# Patient Record
Sex: Female | Born: 1945
Health system: Southern US, Community
[De-identification: ages and names within clinical notes are randomized; demographics above are authoritative.]

## PROBLEM LIST (undated history)

## (undated) DIAGNOSIS — E119 Type 2 diabetes mellitus without complications: Secondary | ICD-10-CM

## (undated) DIAGNOSIS — J189 Pneumonia, unspecified organism: Secondary | ICD-10-CM

## (undated) DIAGNOSIS — T8859XA Other complications of anesthesia, initial encounter: Secondary | ICD-10-CM

## (undated) DIAGNOSIS — K219 Gastro-esophageal reflux disease without esophagitis: Secondary | ICD-10-CM

## (undated) DIAGNOSIS — C50811 Malignant neoplasm of overlapping sites of right female breast: Secondary | ICD-10-CM

## (undated) DIAGNOSIS — R112 Nausea with vomiting, unspecified: Secondary | ICD-10-CM

## (undated) DIAGNOSIS — Z9889 Other specified postprocedural states: Secondary | ICD-10-CM

## (undated) DIAGNOSIS — Z8489 Family history of other specified conditions: Secondary | ICD-10-CM

## (undated) DIAGNOSIS — K5792 Diverticulitis of intestine, part unspecified, without perforation or abscess without bleeding: Secondary | ICD-10-CM

## (undated) DIAGNOSIS — R519 Headache, unspecified: Secondary | ICD-10-CM

## (undated) DIAGNOSIS — F32A Depression, unspecified: Secondary | ICD-10-CM

## (undated) DIAGNOSIS — M1990 Unspecified osteoarthritis, unspecified site: Secondary | ICD-10-CM

## (undated) DIAGNOSIS — F419 Anxiety disorder, unspecified: Secondary | ICD-10-CM

## (undated) DIAGNOSIS — R51 Headache: Secondary | ICD-10-CM

## (undated) DIAGNOSIS — F329 Major depressive disorder, single episode, unspecified: Secondary | ICD-10-CM

## (undated) DIAGNOSIS — N814 Uterovaginal prolapse, unspecified: Secondary | ICD-10-CM

## (undated) DIAGNOSIS — T4145XA Adverse effect of unspecified anesthetic, initial encounter: Secondary | ICD-10-CM

## (undated) HISTORY — PX: COLON SURGERY: SHX602

## (undated) HISTORY — PX: HERNIA REPAIR: SHX51

---

## 2003-10-22 ENCOUNTER — Inpatient Hospital Stay (HOSPITAL_COMMUNITY): Admission: AD | Admit: 2003-10-22 | Discharge: 2003-10-26 | Payer: Self-pay | Admitting: Psychiatry

## 2016-03-03 DIAGNOSIS — M5416 Radiculopathy, lumbar region: Secondary | ICD-10-CM | POA: Diagnosis not present

## 2016-03-03 DIAGNOSIS — M25551 Pain in right hip: Secondary | ICD-10-CM | POA: Diagnosis not present

## 2016-03-03 DIAGNOSIS — M87851 Other osteonecrosis, right femur: Secondary | ICD-10-CM | POA: Diagnosis not present

## 2016-03-03 DIAGNOSIS — M87051 Idiopathic aseptic necrosis of right femur: Secondary | ICD-10-CM | POA: Diagnosis not present

## 2016-03-03 DIAGNOSIS — M545 Low back pain: Secondary | ICD-10-CM | POA: Diagnosis not present

## 2016-03-03 DIAGNOSIS — M79671 Pain in right foot: Secondary | ICD-10-CM | POA: Diagnosis not present

## 2016-03-15 DIAGNOSIS — M5410 Radiculopathy, site unspecified: Secondary | ICD-10-CM | POA: Diagnosis not present

## 2016-03-15 DIAGNOSIS — M25551 Pain in right hip: Secondary | ICD-10-CM | POA: Diagnosis not present

## 2016-03-15 DIAGNOSIS — M5136 Other intervertebral disc degeneration, lumbar region: Secondary | ICD-10-CM | POA: Diagnosis not present

## 2016-04-08 DIAGNOSIS — D485 Neoplasm of uncertain behavior of skin: Secondary | ICD-10-CM | POA: Diagnosis not present

## 2016-04-08 DIAGNOSIS — G43709 Chronic migraine without aura, not intractable, without status migrainosus: Secondary | ICD-10-CM | POA: Diagnosis not present

## 2016-04-08 DIAGNOSIS — Z9181 History of falling: Secondary | ICD-10-CM | POA: Diagnosis not present

## 2016-04-08 DIAGNOSIS — R7301 Impaired fasting glucose: Secondary | ICD-10-CM | POA: Diagnosis not present

## 2016-04-08 DIAGNOSIS — K219 Gastro-esophageal reflux disease without esophagitis: Secondary | ICD-10-CM | POA: Diagnosis not present

## 2016-04-08 DIAGNOSIS — Z79899 Other long term (current) drug therapy: Secondary | ICD-10-CM | POA: Diagnosis not present

## 2016-04-08 DIAGNOSIS — E785 Hyperlipidemia, unspecified: Secondary | ICD-10-CM | POA: Diagnosis not present

## 2016-04-08 DIAGNOSIS — Z23 Encounter for immunization: Secondary | ICD-10-CM | POA: Diagnosis not present

## 2016-04-08 DIAGNOSIS — E669 Obesity, unspecified: Secondary | ICD-10-CM | POA: Diagnosis not present

## 2016-04-08 DIAGNOSIS — Z0000 Encounter for general adult medical examination without abnormal findings: Secondary | ICD-10-CM | POA: Diagnosis not present

## 2016-04-08 DIAGNOSIS — L728 Other follicular cysts of the skin and subcutaneous tissue: Secondary | ICD-10-CM | POA: Diagnosis not present

## 2016-04-08 DIAGNOSIS — Z1211 Encounter for screening for malignant neoplasm of colon: Secondary | ICD-10-CM | POA: Diagnosis not present

## 2016-04-08 DIAGNOSIS — F329 Major depressive disorder, single episode, unspecified: Secondary | ICD-10-CM | POA: Diagnosis not present

## 2016-05-21 DIAGNOSIS — Z1211 Encounter for screening for malignant neoplasm of colon: Secondary | ICD-10-CM | POA: Diagnosis not present

## 2016-05-21 DIAGNOSIS — Z8719 Personal history of other diseases of the digestive system: Secondary | ICD-10-CM | POA: Diagnosis not present

## 2016-06-21 DIAGNOSIS — Z8719 Personal history of other diseases of the digestive system: Secondary | ICD-10-CM | POA: Diagnosis not present

## 2016-06-21 DIAGNOSIS — K621 Rectal polyp: Secondary | ICD-10-CM | POA: Diagnosis not present

## 2016-06-21 DIAGNOSIS — Z1211 Encounter for screening for malignant neoplasm of colon: Secondary | ICD-10-CM | POA: Diagnosis not present

## 2016-06-21 DIAGNOSIS — Z9049 Acquired absence of other specified parts of digestive tract: Secondary | ICD-10-CM | POA: Diagnosis not present

## 2016-06-21 DIAGNOSIS — K5730 Diverticulosis of large intestine without perforation or abscess without bleeding: Secondary | ICD-10-CM | POA: Diagnosis not present

## 2016-06-21 DIAGNOSIS — Z7982 Long term (current) use of aspirin: Secondary | ICD-10-CM | POA: Diagnosis not present

## 2016-06-21 DIAGNOSIS — M810 Age-related osteoporosis without current pathological fracture: Secondary | ICD-10-CM | POA: Diagnosis not present

## 2016-06-21 DIAGNOSIS — K635 Polyp of colon: Secondary | ICD-10-CM | POA: Diagnosis not present

## 2016-06-21 DIAGNOSIS — Z79899 Other long term (current) drug therapy: Secondary | ICD-10-CM | POA: Diagnosis not present

## 2016-06-21 DIAGNOSIS — R7303 Prediabetes: Secondary | ICD-10-CM | POA: Diagnosis not present

## 2016-06-21 DIAGNOSIS — K648 Other hemorrhoids: Secondary | ICD-10-CM | POA: Diagnosis not present

## 2016-06-21 DIAGNOSIS — D128 Benign neoplasm of rectum: Secondary | ICD-10-CM | POA: Diagnosis not present

## 2016-06-21 DIAGNOSIS — K219 Gastro-esophageal reflux disease without esophagitis: Secondary | ICD-10-CM | POA: Diagnosis not present

## 2016-07-06 DIAGNOSIS — D485 Neoplasm of uncertain behavior of skin: Secondary | ICD-10-CM | POA: Diagnosis not present

## 2016-07-30 DIAGNOSIS — H6122 Impacted cerumen, left ear: Secondary | ICD-10-CM | POA: Diagnosis not present

## 2016-07-30 DIAGNOSIS — R42 Dizziness and giddiness: Secondary | ICD-10-CM | POA: Diagnosis not present

## 2016-07-30 DIAGNOSIS — H9202 Otalgia, left ear: Secondary | ICD-10-CM | POA: Diagnosis not present

## 2016-08-02 DIAGNOSIS — R42 Dizziness and giddiness: Secondary | ICD-10-CM | POA: Diagnosis not present

## 2016-08-02 DIAGNOSIS — H60502 Unspecified acute noninfective otitis externa, left ear: Secondary | ICD-10-CM | POA: Diagnosis not present

## 2016-08-02 DIAGNOSIS — H6122 Impacted cerumen, left ear: Secondary | ICD-10-CM | POA: Diagnosis not present

## 2016-10-26 DIAGNOSIS — Z1239 Encounter for other screening for malignant neoplasm of breast: Secondary | ICD-10-CM | POA: Diagnosis not present

## 2016-10-26 DIAGNOSIS — N812 Incomplete uterovaginal prolapse: Secondary | ICD-10-CM | POA: Diagnosis not present

## 2016-10-26 DIAGNOSIS — N8110 Cystocele, unspecified: Secondary | ICD-10-CM | POA: Diagnosis not present

## 2016-10-26 DIAGNOSIS — Z01411 Encounter for gynecological examination (general) (routine) with abnormal findings: Secondary | ICD-10-CM | POA: Diagnosis not present

## 2016-12-17 DIAGNOSIS — R1084 Generalized abdominal pain: Secondary | ICD-10-CM | POA: Diagnosis not present

## 2016-12-17 DIAGNOSIS — K591 Functional diarrhea: Secondary | ICD-10-CM | POA: Diagnosis not present

## 2016-12-17 DIAGNOSIS — Z6832 Body mass index (BMI) 32.0-32.9, adult: Secondary | ICD-10-CM | POA: Diagnosis not present

## 2016-12-17 DIAGNOSIS — R1083 Colic: Secondary | ICD-10-CM | POA: Diagnosis not present

## 2017-04-19 DIAGNOSIS — N819 Female genital prolapse, unspecified: Secondary | ICD-10-CM | POA: Diagnosis not present

## 2017-04-19 DIAGNOSIS — N8110 Cystocele, unspecified: Secondary | ICD-10-CM | POA: Diagnosis not present

## 2017-04-19 DIAGNOSIS — N814 Uterovaginal prolapse, unspecified: Secondary | ICD-10-CM | POA: Insufficient documentation

## 2017-04-19 DIAGNOSIS — N3946 Mixed incontinence: Secondary | ICD-10-CM | POA: Diagnosis not present

## 2017-05-30 DIAGNOSIS — E785 Hyperlipidemia, unspecified: Secondary | ICD-10-CM | POA: Diagnosis not present

## 2017-05-30 DIAGNOSIS — E669 Obesity, unspecified: Secondary | ICD-10-CM | POA: Diagnosis not present

## 2017-05-30 DIAGNOSIS — R7301 Impaired fasting glucose: Secondary | ICD-10-CM | POA: Diagnosis not present

## 2017-05-30 DIAGNOSIS — F329 Major depressive disorder, single episode, unspecified: Secondary | ICD-10-CM | POA: Diagnosis not present

## 2017-05-30 DIAGNOSIS — K219 Gastro-esophageal reflux disease without esophagitis: Secondary | ICD-10-CM | POA: Diagnosis not present

## 2017-05-30 DIAGNOSIS — Z9181 History of falling: Secondary | ICD-10-CM | POA: Diagnosis not present

## 2017-05-30 DIAGNOSIS — Z6833 Body mass index (BMI) 33.0-33.9, adult: Secondary | ICD-10-CM | POA: Diagnosis not present

## 2017-05-30 DIAGNOSIS — Z1331 Encounter for screening for depression: Secondary | ICD-10-CM | POA: Diagnosis not present

## 2017-05-30 DIAGNOSIS — Z0000 Encounter for general adult medical examination without abnormal findings: Secondary | ICD-10-CM | POA: Diagnosis not present

## 2017-05-30 DIAGNOSIS — Z79899 Other long term (current) drug therapy: Secondary | ICD-10-CM | POA: Diagnosis not present

## 2017-05-30 DIAGNOSIS — G43709 Chronic migraine without aura, not intractable, without status migrainosus: Secondary | ICD-10-CM | POA: Diagnosis not present

## 2017-07-11 DIAGNOSIS — Z6833 Body mass index (BMI) 33.0-33.9, adult: Secondary | ICD-10-CM | POA: Diagnosis not present

## 2017-07-11 DIAGNOSIS — S46812A Strain of other muscles, fascia and tendons at shoulder and upper arm level, left arm, initial encounter: Secondary | ICD-10-CM | POA: Diagnosis not present

## 2017-07-19 DIAGNOSIS — Z1231 Encounter for screening mammogram for malignant neoplasm of breast: Secondary | ICD-10-CM | POA: Diagnosis not present

## 2017-08-25 DIAGNOSIS — N6311 Unspecified lump in the right breast, upper outer quadrant: Secondary | ICD-10-CM | POA: Diagnosis not present

## 2017-08-25 DIAGNOSIS — R922 Inconclusive mammogram: Secondary | ICD-10-CM | POA: Diagnosis not present

## 2017-08-25 DIAGNOSIS — R928 Other abnormal and inconclusive findings on diagnostic imaging of breast: Secondary | ICD-10-CM | POA: Diagnosis not present

## 2017-08-25 DIAGNOSIS — N6312 Unspecified lump in the right breast, upper inner quadrant: Secondary | ICD-10-CM | POA: Diagnosis not present

## 2017-09-05 DIAGNOSIS — D0591 Unspecified type of carcinoma in situ of right breast: Secondary | ICD-10-CM | POA: Diagnosis not present

## 2017-09-05 DIAGNOSIS — N6322 Unspecified lump in the left breast, upper inner quadrant: Secondary | ICD-10-CM | POA: Diagnosis not present

## 2017-09-05 DIAGNOSIS — C50811 Malignant neoplasm of overlapping sites of right female breast: Secondary | ICD-10-CM | POA: Diagnosis not present

## 2017-09-05 DIAGNOSIS — Z170 Estrogen receptor positive status [ER+]: Secondary | ICD-10-CM | POA: Diagnosis not present

## 2017-09-05 DIAGNOSIS — N6321 Unspecified lump in the left breast, upper outer quadrant: Secondary | ICD-10-CM | POA: Diagnosis not present

## 2017-09-15 DIAGNOSIS — C50111 Malignant neoplasm of central portion of right female breast: Secondary | ICD-10-CM | POA: Diagnosis not present

## 2017-09-15 DIAGNOSIS — Z6834 Body mass index (BMI) 34.0-34.9, adult: Secondary | ICD-10-CM | POA: Diagnosis not present

## 2017-09-15 DIAGNOSIS — E785 Hyperlipidemia, unspecified: Secondary | ICD-10-CM | POA: Diagnosis not present

## 2017-09-15 DIAGNOSIS — F329 Major depressive disorder, single episode, unspecified: Secondary | ICD-10-CM | POA: Diagnosis not present

## 2017-09-15 DIAGNOSIS — Z8719 Personal history of other diseases of the digestive system: Secondary | ICD-10-CM | POA: Diagnosis not present

## 2017-09-20 ENCOUNTER — Other Ambulatory Visit: Payer: Self-pay | Admitting: General Surgery

## 2017-09-20 DIAGNOSIS — C50111 Malignant neoplasm of central portion of right female breast: Secondary | ICD-10-CM

## 2017-09-28 DIAGNOSIS — Z8042 Family history of malignant neoplasm of prostate: Secondary | ICD-10-CM

## 2017-09-28 DIAGNOSIS — Z170 Estrogen receptor positive status [ER+]: Secondary | ICD-10-CM | POA: Diagnosis not present

## 2017-09-28 DIAGNOSIS — Z803 Family history of malignant neoplasm of breast: Secondary | ICD-10-CM

## 2017-09-28 DIAGNOSIS — C50111 Malignant neoplasm of central portion of right female breast: Secondary | ICD-10-CM | POA: Diagnosis not present

## 2017-09-28 DIAGNOSIS — Z807 Family history of other malignant neoplasms of lymphoid, hematopoietic and related tissues: Secondary | ICD-10-CM | POA: Diagnosis not present

## 2017-09-28 DIAGNOSIS — Z801 Family history of malignant neoplasm of trachea, bronchus and lung: Secondary | ICD-10-CM | POA: Diagnosis not present

## 2017-10-02 ENCOUNTER — Ambulatory Visit
Admission: RE | Admit: 2017-10-02 | Discharge: 2017-10-02 | Disposition: A | Discharge status: Home / Self Care | Payer: PPO | Source: Ambulatory Visit | Attending: General Surgery | Admitting: General Surgery

## 2017-10-02 DIAGNOSIS — D0502 Lobular carcinoma in situ of left breast: Secondary | ICD-10-CM | POA: Diagnosis not present

## 2017-10-02 DIAGNOSIS — C50111 Malignant neoplasm of central portion of right female breast: Secondary | ICD-10-CM

## 2017-10-02 MED ORDER — GADOBENATE DIMEGLUMINE 529 MG/ML IV SOLN
15.0000 mL | Freq: Once | INTRAVENOUS | Status: AC | PRN
  Administered 2017-10-02: 15 mL via INTRAVENOUS

## 2017-10-03 DIAGNOSIS — C50111 Malignant neoplasm of central portion of right female breast: Secondary | ICD-10-CM | POA: Diagnosis not present

## 2017-10-03 DIAGNOSIS — Z510 Encounter for antineoplastic radiation therapy: Secondary | ICD-10-CM | POA: Diagnosis not present

## 2017-10-03 DIAGNOSIS — C50811 Malignant neoplasm of overlapping sites of right female breast: Secondary | ICD-10-CM | POA: Diagnosis not present

## 2017-10-04 ENCOUNTER — Other Ambulatory Visit: Payer: Self-pay | Admitting: General Surgery

## 2017-10-04 DIAGNOSIS — C50911 Malignant neoplasm of unspecified site of right female breast: Secondary | ICD-10-CM

## 2017-10-06 DIAGNOSIS — H52223 Regular astigmatism, bilateral: Secondary | ICD-10-CM | POA: Diagnosis not present

## 2017-10-06 DIAGNOSIS — H2513 Age-related nuclear cataract, bilateral: Secondary | ICD-10-CM | POA: Diagnosis not present

## 2017-10-07 ENCOUNTER — Other Ambulatory Visit: Payer: Self-pay | Admitting: General Surgery

## 2017-10-07 DIAGNOSIS — C50911 Malignant neoplasm of unspecified site of right female breast: Secondary | ICD-10-CM

## 2017-10-14 NOTE — Pre-Procedure Instructions (Addendum)
[place]  10/14/2017      [place], [place] [address] Phone: [phone] Fax: [phone]    Your procedure is scheduled on October 19, 2017.  Report to [facility] Admitting at 800 AM.  Call this number if you have problems the morning of surgery:  [phone]   Remember:  Do not eat after midnight.   You may drink clear liquids until 0700 AM    Take these medicines the morning of surgery with A SIP OF WATER  Tylenol-if needed Venlafaxine XR    7 days prior to surgery STOP taking any Aspirin (unless otherwise instructed by your surgeon), Aleve, Naproxen, Ibuprofen, Motrin, Advil, Goody's, BC's, all herbal medications, fish oil, and all vitamins   Do not wear jewelry, make-up or nail polish.  Do not wear lotions, powders, or perfumes, or deodorant.  Do not shave 48 hours prior to surgery.   Do not bring valuables to the hospital.  [facility] is not responsible for any belongings or valuables.  Contacts, dentures or bridgework may not be worn into surgery.  Leave your suitcase in the car.  After surgery it may be brought to your room.  For patients admitted to the hospital, discharge time will be determined by your treatment team.  Patients discharged the day of surgery will not be allowed to drive home.    [facility]- Preparing For Surgery  Before surgery, you can play an important role. Because skin is not sterile, your skin needs to be as free of germs as possible. You can reduce the number of germs on your skin by washing with CHG (chlorahexidine gluconate) Soap before surgery.  CHG is an antiseptic cleaner which kills germs and bonds with the skin to continue killing germs even after washing.    Oral Hygiene is also important to reduce your risk of infection.  Remember - BRUSH YOUR TEETH THE MORNING OF SURGERY WITH YOUR REGULAR TOOTHPASTE  Please do not use if  you have an allergy to CHG or antibacterial soaps. If your skin becomes reddened/irritated stop using the CHG.  Do not shave (including legs and underarms) for at least 48 hours prior to first CHG shower. It is OK to shave your face.  Please follow these instructions carefully.   1. Shower the NIGHT BEFORE SURGERY and the MORNING OF SURGERY with CHG.   2. If you chose to wash your hair, wash your hair first as usual with your normal shampoo.  3. After you shampoo, rinse your hair and body thoroughly to remove the shampoo.  4. Use CHG as you would any other liquid soap. You can apply CHG directly to the skin and wash gently with a scrungie or a clean washcloth.   5. Apply the CHG Soap to your body ONLY FROM THE NECK DOWN.  Do not use on open wounds or open sores. Avoid contact with your eyes, ears, mouth and genitals (private parts). Wash Face and genitals (private parts)  with your normal soap.  6. Wash thoroughly, paying special attention to the area where your surgery will be performed.  7. Thoroughly rinse your body with warm water from the neck down.  8. DO NOT shower/wash with your normal soap after using and rinsing off the CHG Soap.  9. Pat yourself dry with a CLEAN TOWEL.  10. Wear CLEAN PAJAMAS to bed  the night before surgery, wear comfortable clothes the morning of surgery  11. Place CLEAN SHEETS on your bed the night of your first shower and DO NOT SLEEP WITH PETS.  Day of Surgery:  Do not apply any deodorants/lotions.  Please wear clean clothes to the hospital/surgery center.   Remember to brush your teeth WITH YOUR REGULAR TOOTHPASTE.  Please read over the following fact sheets that you were given. Pain Booklet, Coughing and Deep Breathing and Surgical Site Infection Prevention

## 2017-10-15 NOTE — H&P (Signed)
Sheena Joyce Location: [facility] Patient #: [id number] DOB: 1945-10-10 Married / Language: English / Race: White Female        History of Present Illness         This is a 72 year old female, referred to me by Dr. Jeanmarie Plant in radiology for evaluation of invasive lobular carcinoma right breast. Sheena Joyce is her PCP. The patient is from [place]. Her husband is with her throughout the encounter.      She has no prior history of breast problems. Basically gets annual mammograms. She felt a little thickening in the right breast above the areolar margin. Bilateral mammograms were performed on Jul 15, 2017. Follow-up imaging was performed on August 23, 2017. The left breast looks normal. In the right breast there are 2 masses at the 12 o'clock position. A 1.1 cm mass 2 cm from the nipple and the 1.3 cm mass 4 cm from the nipple. The right axilla looks normal. Image guided biopsy of both masses was performed. Both biopsies showed invasive lobular carcinoma. Grade 1. ER 100%. PR 95%. ki- 67 1%. HER-2 negative. She has done reasonably well since the biopsies with just a small area of ecchymoses.      Past history reveals no prior breast problems. Depression controlled on Effexor. Hyperlipidemia. Diverticulitis requiring sigmoid resection in [place] in 2015. Ventral hernia repair in 2016. Family history is negative for breast or ovarian cancer. Mother died age 90 had a stroke. Father died age 78 of heart disease. Sister was treated for leukemia. Brother had lung cancer with metastasis. Social history reveals she still works part-time as a Theme park manager. Married. 2 grown sons. Lives in [place]. Denies alcohol or tobacco.       We had a long discussion. I explained the location and size of her tumors. I was hopeful that we will be able to do breast conservation surgery but we will have to do further evaluation. She knows that lobular carcinoma is difficult to  evaluate radiographically with mammograms and she will need an MRI. She knows that she will need lymph node sampling with sentinel lymph node biopsy. She knows that we may need to do a mastectomy if we find more extensive disease      She'll be scheduled for bilateral breast MRI She will be referred to Dr. Hinton Rao in [facility] for medical oncology consultation She'll be referred to Dr. Orlene Erm in [place] for radiation oncology consultation She will be added on to breast conference at the earliest convenience plan return to [facility] me in 2-3 weeks and we'll decide where to go from there. Hopefully the MRI will showed localized disease and we will not need any further biopsies    Addendum Note(Aldan Camey M. Dalbert Batman MD; 10/04/2017 5:58 PM)      MRI shows adjacent enhancing masses in the slightly upper inner right breast consistent with known biopsy-proven invasive lobular carcinoma. The biopsy clips are approximately 3 cm apart. There is no MRI evidence elsewhere in the right breast or left breast or any of the lymph nodes I called her and discussed this with her She has seen Dr. Orlene Erm and Dr. Hinton Rao Options for surgical intervention were discussed and she prefers breast conservation. I think that is reasonable      She wants to go ahead and schedule surgery and does not need an office visit She will be scheduled for right breast lumpectomy 2 with radioactive seed localization 2 and right axillary deep sentinel lymph node biopsy  I discussed the indications, details, techniques, and numerous risk of the surgery with her. She is aware of the risk of bleeding, infection, cosmetic deformity, nerve damage with chronic pain, reoperation for positive nodes are positive margins, arm swelling, arm numbness. She understands all of these issues. All of her questions are answered. She agrees with this plan.     Past Surgical History  Breast Biopsy  Right. Colon Removal - Partial  Oral Surgery   Tonsillectomy   Diagnostic Studies History  Colonoscopy  within last year Mammogram  within last year  Allergies  No Known Drug Allergies Allergies Reconciled   Medication History Venlafaxine HCl (75MG Tablet, Oral) Active. Simvastatin (20MG Tablet, Oral) Active. Rizatriptan Benzoate (10MG Tablet, Oral) Active. Medications Reconciled  Social History  Caffeine use  Carbonated beverages, Coffee. No alcohol use  No drug use  Tobacco use  Never smoker.  Family History  Alcohol Abuse  Father. Arthritis  Mother, Sister. Cancer  Brother, Sister. Cerebrovascular Accident  Mother. Depression  Father, Sister, Son. Heart Disease  Father, Sister. Heart disease in female family member before age 67  Migraine Headache  Brother, Father, Mother, Sister. Respiratory Condition  Brother.  Pregnancy / Birth History  Age at menarche  56 years. Age of menopause  51-55 Contraceptive History  Oral contraceptives. Gravida  2 Maternal age  9-25 Para  2  Other Problems  Back Pain  Bladder Problems  Breast Cancer  Hypercholesterolemia  Lump In Breast  Migraine Headache     Review of Systems  General Present- Fatigue. Not Present- Appetite Loss, Chills, Fever, Night Sweats, Weight Gain and Weight Loss. Skin Present- Dryness. Not Present- Change in Wart/Mole, Hives, Jaundice, New Lesions, Non-Healing Wounds, Rash and Ulcer. HEENT Present- Hearing Loss, Hoarseness, Ringing in the Ears and Wears glasses/contact lenses. Not Present- Earache, Nose Bleed, Oral Ulcers, Seasonal Allergies, Sinus Pain, Sore Throat, Visual Disturbances and Yellow Eyes. Breast Present- Breast Mass. Not Present- Breast Pain, Nipple Discharge and Skin Changes. Gastrointestinal Present- Indigestion. Not Present- Abdominal Pain, Bloating, Bloody Stool, Change in Bowel Habits, Chronic diarrhea, Constipation, Difficulty Swallowing, Excessive gas, Gets full quickly at meals, Hemorrhoids,  Nausea, Rectal Pain and Vomiting. Neurological Present- Headaches. Not Present- Decreased Memory, Fainting, Numbness, Seizures, Tingling, Tremor, Trouble walking and Weakness. Psychiatric Present- Depression. Not Present- Anxiety, Bipolar, Change in Sleep Pattern, Fearful and Frequent crying.  Vitals Weight: 174.2 lb Height: 60in Body Surface Area: 1.76 m Body Mass Index: 34.02 kg/m  Temp.: 97.44F  Pulse: 95 (Regular)  BP: 124/86 (Sitting, Left Arm, Standard)    Physical Exam  General Mental Status-Alert. General Appearance-Consistent with stated age. Hydration-Well hydrated. Voice-Normal. Note: BMI 34   Head and Neck Head-normocephalic, atraumatic with no lesions or palpable masses. Trachea-midline. Thyroid Gland Characteristics - normal size and consistency.  Eye Eyeball - Bilateral-Extraocular movements intact. Sclera/Conjunctiva - Bilateral-No scleral icterus.  Chest and Lung Exam Chest and lung exam reveals -quiet, even and easy respiratory effort with no use of accessory muscles and on auscultation, normal breath sounds, no adventitious sounds and normal vocal resonance. Inspection Chest Wall - Normal. Back - normal.  Breast Breast - Left-Symmetric, Non Tender, No Biopsy scars, no Dimpling, No Inflammation, No Lumpectomy scars, No Mastectomy scars, No Peau d' Orange. Breast - Right-Symmetric, Non Tender, No Biopsy scars, no Dimpling, No Inflammation, No Lumpectomy scars, No Mastectomy scars, No Peau d' Orange. Breast Lump-No Palpable Breast Mass. Note: Breasts are medium size. Not small. Ecchymoses at 12:00. Slight thickening at 12:00 on the right.  No other skin changes. No other masses. No axillary adenopathy.   Cardiovascular Cardiovascular examination reveals -normal heart sounds, regular rate and rhythm with no murmurs and normal pedal pulses bilaterally.  Abdomen Inspection Inspection of the abdomen reveals - No  Hernias. Skin - Scar - Note: Upper midline scar. Transverse left upper quadrant scar. Hernia repair appears intact for the most part but there is a little bit of weakness in the upper midline. No organomegaly or tenderness. Palpation/Percussion Palpation and Percussion of the abdomen reveal - Soft, Non Tender, No Rebound tenderness, No Rigidity (guarding) and No hepatosplenomegaly. Auscultation Auscultation of the abdomen reveals - Bowel sounds normal.  Neurologic Neurologic evaluation reveals -alert and oriented x 3 with no impairment of recent or remote memory. Mental Status-Normal.  Neuropsychiatric Note: Appropriately anxious. Lots of questions.   Musculoskeletal Normal Exam - Left-Upper Extremity Strength Normal and Lower Extremity Strength Normal. Normal Exam - Right-Upper Extremity Strength Normal and Lower Extremity Strength Normal.  Lymphatic Head & Neck  General Head & Neck Lymphatics: Bilateral - Description - Normal. Axillary  General Axillary Region: Bilateral - Description - Normal. Tenderness - Non Tender. Femoral & Inguinal  Generalized Femoral & Inguinal Lymphatics: Bilateral - Description - Normal. Tenderness - Non Tender.    Assessment & Plan  CANCER OF CENTRAL PORTION OF RIGHT BREAST (C50.111)    You felt a small lump in your right breast above the nipple Imaging studies and biopsy shows 2 small areas of invasive lobular carcinoma. These are immediately adjacent to each other One cancer is 1.3 cm in size and the second cancer is 1.1 cm in size Ultrasound of the right axilla shows the lymph nodes to look normal The left breast looks normal      Invasive lobular carcinoma is difficult to assess radiographically with mammograms alone For this reason you will be scheduled for bilateral breast MRI to make sure that we have thoroughly assessed the extent of your disease You'll be referred to Dr. Hinton Rao in [place] for medical oncology  consultation you'll also be referred to Dr. Orlene Erm in [place] for radiation oncology consultation. You may or may not need radiation therapy We will schedule you for discussion at our Wednesday morning breast conference at the next opportunity      Return to [facility] Dr. Dalbert Batman in 2-3 weeks after all of this is done and hopefully we will make a final decision about the extent of surgery Surgical goals are to completely remove all cancer from your breast with negative margins and to sample the lymph nodes under your right arm. Hopefully you will be a candidate for breast conservation as an option  HISTORY OF DIVERTICULITIS OF COLON (Z87.19) BMI 34.0-34.9,ADULT (Z68.34) DEPRESSION, CONTROLLED (F32.9) HYPERLIPIDEMIA, ACQUIRED (E78.5)   Caymen Dubray M. Dalbert Batman, M.D., [facility] General and Minimally invasive Surgery Breast and Colorectal Surgery Office:   [phone] Pager:   [phone]

## 2017-10-17 ENCOUNTER — Other Ambulatory Visit: Payer: Self-pay

## 2017-10-17 ENCOUNTER — Encounter (HOSPITAL_COMMUNITY): Payer: Self-pay

## 2017-10-17 ENCOUNTER — Encounter (HOSPITAL_COMMUNITY)
Admission: RE | Admit: 2017-10-17 | Discharge: 2017-10-17 | Disposition: A | Discharge status: Home / Self Care | Payer: PPO | Source: Ambulatory Visit | Attending: General Surgery | Admitting: General Surgery

## 2017-10-17 DIAGNOSIS — Z836 Family history of other diseases of the respiratory system: Secondary | ICD-10-CM | POA: Diagnosis not present

## 2017-10-17 DIAGNOSIS — Z820 Family history of epilepsy and other diseases of the nervous system: Secondary | ICD-10-CM | POA: Diagnosis not present

## 2017-10-17 DIAGNOSIS — Z8261 Family history of arthritis: Secondary | ICD-10-CM | POA: Diagnosis not present

## 2017-10-17 DIAGNOSIS — Z170 Estrogen receptor positive status [ER+]: Secondary | ICD-10-CM | POA: Diagnosis not present

## 2017-10-17 DIAGNOSIS — C773 Secondary and unspecified malignant neoplasm of axilla and upper limb lymph nodes: Secondary | ICD-10-CM | POA: Diagnosis not present

## 2017-10-17 DIAGNOSIS — Z818 Family history of other mental and behavioral disorders: Secondary | ICD-10-CM | POA: Diagnosis not present

## 2017-10-17 DIAGNOSIS — M549 Dorsalgia, unspecified: Secondary | ICD-10-CM | POA: Diagnosis not present

## 2017-10-17 DIAGNOSIS — Z811 Family history of alcohol abuse and dependence: Secondary | ICD-10-CM | POA: Diagnosis not present

## 2017-10-17 DIAGNOSIS — G43909 Migraine, unspecified, not intractable, without status migrainosus: Secondary | ICD-10-CM | POA: Diagnosis not present

## 2017-10-17 DIAGNOSIS — Z806 Family history of leukemia: Secondary | ICD-10-CM | POA: Diagnosis not present

## 2017-10-17 DIAGNOSIS — F329 Major depressive disorder, single episode, unspecified: Secondary | ICD-10-CM | POA: Diagnosis not present

## 2017-10-17 DIAGNOSIS — Z8249 Family history of ischemic heart disease and other diseases of the circulatory system: Secondary | ICD-10-CM | POA: Diagnosis not present

## 2017-10-17 DIAGNOSIS — C50911 Malignant neoplasm of unspecified site of right female breast: Secondary | ICD-10-CM | POA: Diagnosis not present

## 2017-10-17 DIAGNOSIS — Z801 Family history of malignant neoplasm of trachea, bronchus and lung: Secondary | ICD-10-CM | POA: Diagnosis not present

## 2017-10-17 DIAGNOSIS — Z823 Family history of stroke: Secondary | ICD-10-CM | POA: Diagnosis not present

## 2017-10-17 DIAGNOSIS — C50111 Malignant neoplasm of central portion of right female breast: Secondary | ICD-10-CM | POA: Diagnosis not present

## 2017-10-17 DIAGNOSIS — Z79899 Other long term (current) drug therapy: Secondary | ICD-10-CM | POA: Diagnosis not present

## 2017-10-17 DIAGNOSIS — E785 Hyperlipidemia, unspecified: Secondary | ICD-10-CM | POA: Diagnosis not present

## 2017-10-17 DIAGNOSIS — E7800 Pure hypercholesterolemia, unspecified: Secondary | ICD-10-CM | POA: Diagnosis not present

## 2017-10-17 HISTORY — DX: Uterovaginal prolapse, unspecified: N81.4

## 2017-10-17 HISTORY — DX: Pneumonia, unspecified organism: J18.9

## 2017-10-17 HISTORY — DX: Anxiety disorder, unspecified: F41.9

## 2017-10-17 HISTORY — DX: Major depressive disorder, single episode, unspecified: F32.9

## 2017-10-17 HISTORY — DX: Type 2 diabetes mellitus without complications: E11.9

## 2017-10-17 HISTORY — DX: Headache: R51

## 2017-10-17 HISTORY — DX: Other complications of anesthesia, initial encounter: T88.59XA

## 2017-10-17 HISTORY — DX: Headache, unspecified: R51.9

## 2017-10-17 HISTORY — DX: Diverticulitis of intestine, part unspecified, without perforation or abscess without bleeding: K57.92

## 2017-10-17 HISTORY — DX: Unspecified osteoarthritis, unspecified site: M19.90

## 2017-10-17 HISTORY — DX: Depression, unspecified: F32.A

## 2017-10-17 HISTORY — DX: Adverse effect of unspecified anesthetic, initial encounter: T41.45XA

## 2017-10-17 LAB — COMPREHENSIVE METABOLIC PANEL
ALBUMIN: 3.8 g/dL (ref 3.5–5.0)
ALK PHOS: 64 U/L (ref 38–126)
ALT: 17 U/L (ref 0–44)
AST: 22 U/L (ref 15–41)
Anion gap: 7 (ref 5–15)
BILIRUBIN TOTAL: 0.5 mg/dL (ref 0.3–1.2)
BUN: 15 mg/dL (ref 8–23)
CALCIUM: 9.3 mg/dL (ref 8.9–10.3)
CO2: 29 mmol/L (ref 22–32)
Chloride: 101 mmol/L (ref 98–111)
Creatinine, Ser: 0.88 mg/dL (ref 0.44–1.00)
GFR calc Af Amer: >60 mL/min (ref >60)
Glucose, Bld: 102 mg/dL — ABNORMAL HIGH (ref 70–99)
POTASSIUM: 3.8 mmol/L (ref 3.5–5.1)
Sodium: 137 mmol/L (ref 135–145)
Total Protein: 6.6 g/dL (ref 6.5–8.1)

## 2017-10-17 LAB — CBC WITH DIFFERENTIAL/PLATELET
Abs Immature Granulocytes: 0 10*3/uL (ref 0.0–0.1)
Basophils Absolute: 0.1 10*3/uL (ref 0.0–0.1)
Basophils Relative: 1 %
Eosinophils Absolute: 0.2 10*3/uL (ref 0.0–0.7)
Eosinophils Relative: 2 %
HCT: 44.2 % (ref 36.0–46.0)
HEMOGLOBIN: 13.6 g/dL (ref 12.0–15.0)
Immature Granulocytes: 0 %
LYMPHS ABS: 2.5 10*3/uL (ref 0.7–4.0)
LYMPHS PCT: 31 %
MCH: 29.3 pg (ref 26.0–34.0)
MCHC: 30.8 g/dL (ref 30.0–36.0)
MCV: 95.3 fL (ref 78.0–100.0)
MONOS PCT: 11 %
Monocytes Absolute: 0.9 10*3/uL (ref 0.1–1.0)
Neutro Abs: 4.4 10*3/uL (ref 1.7–7.7)
Neutrophils Relative %: 55 %
Platelets: 268 10*3/uL (ref 150–400)
RBC: 4.64 MIL/uL (ref 3.87–5.11)
RDW: 13 % (ref 11.5–15.5)
WBC: 8 10*3/uL (ref 4.0–10.5)

## 2017-10-17 LAB — GLUCOSE, CAPILLARY: GLUCOSE-CAPILLARY: 66 mg/dL — AB (ref 70–99)

## 2017-10-17 LAB — HEMOGLOBIN A1C
Hgb A1c MFr Bld: 6 % — ABNORMAL HIGH (ref 4.8–5.6)
Mean Plasma Glucose: 125.5 mg/dL

## 2017-10-17 NOTE — Progress Notes (Cosign Needed)
Anesthesia Chart Review:  Case:  [id number] Date/Time:  10/19/17 0945   Procedure:  INJECT BLUE DYE RIGHT BREAST AND RIGHT BREAST LUMPECTOMY X'S 2 WITH RADIOACTIVE SEED X'S 2 AND RIGHT AXILLARY SENTINEL LYMPH NODE BIOPSY (Right Breast)   Anesthesia type:  General   Pre-op diagnosis:  MULTIFOCAL CANCER RIGHT BREAST   Location:  [facility] OR ROOM 08 / [facility] OR   Surgeon:  [name], MD      DISCUSSION:72 yo female never smoker for above procedure. Pertinent Hx includes DMII, Anxiety, HA, Depression, Reported history of anesthetic complication "became unresponsive after surgery".  I saw pt today at her PAT appointment to discuss her concerns regarding anesthesia. She states that in 2016 she had hernia surgery at [facility] and had postop complications. She reports that she initially woke up without issue, but later that day she became "unresponsive" and required medical intervention. She reports that during the hospitalization she underwent cardiac, pulmonary, and neurologic testing that were all benign. She did experience significant postop fluid overload and said she required several days of diuresis to clear the fluid from her lungs. She says she was told she may have had an "allergic reaction" and she is very concerned.  Records requested from [facility] for her last two anesthesia encounters. These records are on pt's chart. 06/21/2016 she had a colonoscopy under general anesthesia and the records indicate there were no anesthetic complications. 09/30/2014 she had hernia repair. This is the surgery after which she reported becoming "unresponsive". Anesthesia records reviewed do not describe any complications and report that postoperatively she was awake/arousable/at baseline.  From the pt's report and review of prior anesthesia records, it does not appear she has had any frank anesthetic complications. She did not have any issues with colonoscopy under general anesthesia in 2018. Her issues seem  likely more related to postoperative medication management. I advised her to make sure her surgeon is aware of this as she may need more judicious postoperative management of pain and fluid balance. She verbalized understanding.   She also reports that she tends to have labile blood glucose, states she previously had a LOC event related to a BG in the 20s and has had readings at other times over 500. Her A1c measured 10/17/2017 is 6.0. She is not on any diabetic medications.   Anticipate she can proceed with surgery as planned barring any acute status change.   VS: BP 117/70   Pulse 76   Temp 36.6 C   Resp 20   Ht 5' (1.524 m)   Wt 78.8 kg   SpO2 96%   BMI 33.92 kg/m   PROVIDERS: Street, [name], MD is PCP   LABS: Labs reviewed: Acceptable for surgery. (all labs ordered are listed, but only abnormal results are displayed)  Labs Reviewed  GLUCOSE, CAPILLARY - Abnormal; Notable for the following components:      Result Value   Glucose-Capillary 66 (*)    All other components within normal limits  HEMOGLOBIN A1C - Abnormal; Notable for the following components:   Hgb A1c MFr Bld 6.0 (*)    All other components within normal limits  COMPREHENSIVE METABOLIC PANEL - Abnormal; Notable for the following components:   Glucose, Bld 102 (*)    All other components within normal limits  CBC WITH DIFFERENTIAL/PLATELET     IMAGES: N/A   EKG: 10/17/2017: Normal sinus rhythm  CV: N/A  Past Medical History:  Diagnosis Date  . Anxiety   . Arthritis   .  Complication of anesthesia    "became unresposive after surgery"  . Depression   . Diabetes mellitus without complication ([place])   . Diverticulitis   . Dyspnea   . Headache   . Pneumonia   . Prolapsed uterus    and bladder    Past Surgical History:  Procedure Laterality Date  . COLON SURGERY    . HERNIA REPAIR      MEDICATIONS: . clotrimazole-betamethasone (LOTRISONE) cream  . acetaminophen (TYLENOL) 500 MG  tablet  . Ascorbic Acid (VITAMIN C PO)  . Cholecalciferol (VITAMIN D3 PO)  . ibuprofen (ADVIL,MOTRIN) 200 MG tablet  . naproxen sodium (ALEVE) 220 MG tablet  . rizatriptan (MAXALT) 10 MG tablet  . simvastatin (ZOCOR) 40 MG tablet  . venlafaxine XR (EFFEXOR-XR) 75 MG 24 hr capsule   No current facility-administered medications for this encounter.     Wynonia Musty [facility]/Anesthesiology Phone [phone] 10/18/2017 10:36 AM

## 2017-10-18 ENCOUNTER — Ambulatory Visit
Admission: RE | Admit: 2017-10-18 | Discharge: 2017-10-18 | Disposition: A | Discharge status: Home / Self Care | Payer: PPO | Source: Ambulatory Visit | Attending: General Surgery | Admitting: General Surgery

## 2017-10-18 ENCOUNTER — Encounter (HOSPITAL_COMMUNITY): Payer: Self-pay

## 2017-10-18 DIAGNOSIS — C50911 Malignant neoplasm of unspecified site of right female breast: Secondary | ICD-10-CM

## 2017-10-18 DIAGNOSIS — C50211 Malignant neoplasm of upper-inner quadrant of right female breast: Secondary | ICD-10-CM | POA: Diagnosis not present

## 2017-10-19 ENCOUNTER — Ambulatory Visit (HOSPITAL_COMMUNITY): Payer: PPO | Admitting: Physician Assistant

## 2017-10-19 ENCOUNTER — Ambulatory Visit (HOSPITAL_COMMUNITY)
Admission: RE | Admit: 2017-10-19 | Discharge: 2017-10-19 | Disposition: A | Discharge status: Home / Self Care | Payer: PPO | Source: Ambulatory Visit | Attending: General Surgery | Admitting: General Surgery

## 2017-10-19 ENCOUNTER — Encounter (HOSPITAL_COMMUNITY)
Admission: RE | Disposition: A | Discharge status: Home / Self Care | Payer: Self-pay | Source: Ambulatory Visit | Attending: General Surgery

## 2017-10-19 ENCOUNTER — Ambulatory Visit
Admission: RE | Admit: 2017-10-19 | Discharge: 2017-10-19 | Disposition: A | Discharge status: Home / Self Care | Payer: PPO | Source: Ambulatory Visit | Attending: General Surgery | Admitting: General Surgery

## 2017-10-19 ENCOUNTER — Encounter (HOSPITAL_COMMUNITY): Payer: Self-pay | Admitting: Urology

## 2017-10-19 ENCOUNTER — Encounter (HOSPITAL_COMMUNITY)
Admission: RE | Admit: 2017-10-19 | Discharge: 2017-10-19 | Disposition: A | Discharge status: Home / Self Care | Payer: PPO | Source: Ambulatory Visit | Attending: General Surgery | Admitting: General Surgery

## 2017-10-19 DIAGNOSIS — Z836 Family history of other diseases of the respiratory system: Secondary | ICD-10-CM | POA: Insufficient documentation

## 2017-10-19 DIAGNOSIS — C50911 Malignant neoplasm of unspecified site of right female breast: Secondary | ICD-10-CM

## 2017-10-19 DIAGNOSIS — E7800 Pure hypercholesterolemia, unspecified: Secondary | ICD-10-CM | POA: Insufficient documentation

## 2017-10-19 DIAGNOSIS — Z811 Family history of alcohol abuse and dependence: Secondary | ICD-10-CM | POA: Diagnosis not present

## 2017-10-19 DIAGNOSIS — E785 Hyperlipidemia, unspecified: Secondary | ICD-10-CM | POA: Diagnosis not present

## 2017-10-19 DIAGNOSIS — G8918 Other acute postprocedural pain: Secondary | ICD-10-CM | POA: Diagnosis not present

## 2017-10-19 DIAGNOSIS — Z820 Family history of epilepsy and other diseases of the nervous system: Secondary | ICD-10-CM | POA: Insufficient documentation

## 2017-10-19 DIAGNOSIS — Z8249 Family history of ischemic heart disease and other diseases of the circulatory system: Secondary | ICD-10-CM | POA: Insufficient documentation

## 2017-10-19 DIAGNOSIS — C50111 Malignant neoplasm of central portion of right female breast: Secondary | ICD-10-CM | POA: Insufficient documentation

## 2017-10-19 DIAGNOSIS — Z806 Family history of leukemia: Secondary | ICD-10-CM | POA: Diagnosis not present

## 2017-10-19 DIAGNOSIS — Z801 Family history of malignant neoplasm of trachea, bronchus and lung: Secondary | ICD-10-CM | POA: Insufficient documentation

## 2017-10-19 DIAGNOSIS — M549 Dorsalgia, unspecified: Secondary | ICD-10-CM | POA: Insufficient documentation

## 2017-10-19 DIAGNOSIS — Z79899 Other long term (current) drug therapy: Secondary | ICD-10-CM | POA: Insufficient documentation

## 2017-10-19 DIAGNOSIS — F329 Major depressive disorder, single episode, unspecified: Secondary | ICD-10-CM | POA: Insufficient documentation

## 2017-10-19 DIAGNOSIS — C50211 Malignant neoplasm of upper-inner quadrant of right female breast: Secondary | ICD-10-CM | POA: Diagnosis not present

## 2017-10-19 DIAGNOSIS — Z823 Family history of stroke: Secondary | ICD-10-CM | POA: Diagnosis not present

## 2017-10-19 DIAGNOSIS — C773 Secondary and unspecified malignant neoplasm of axilla and upper limb lymph nodes: Secondary | ICD-10-CM | POA: Insufficient documentation

## 2017-10-19 DIAGNOSIS — C50811 Malignant neoplasm of overlapping sites of right female breast: Secondary | ICD-10-CM | POA: Diagnosis present

## 2017-10-19 DIAGNOSIS — Z8261 Family history of arthritis: Secondary | ICD-10-CM | POA: Insufficient documentation

## 2017-10-19 DIAGNOSIS — Z818 Family history of other mental and behavioral disorders: Secondary | ICD-10-CM | POA: Insufficient documentation

## 2017-10-19 DIAGNOSIS — G43909 Migraine, unspecified, not intractable, without status migrainosus: Secondary | ICD-10-CM | POA: Insufficient documentation

## 2017-10-19 DIAGNOSIS — Z170 Estrogen receptor positive status [ER+]: Secondary | ICD-10-CM | POA: Diagnosis not present

## 2017-10-19 HISTORY — PX: BREAST LUMPECTOMY WITH RADIOACTIVE SEED AND SENTINEL LYMPH NODE BIOPSY: SHX6550

## 2017-10-19 HISTORY — DX: Malignant neoplasm of overlapping sites of right female breast: C50.811

## 2017-10-19 LAB — GLUCOSE, CAPILLARY
GLUCOSE-CAPILLARY: 94 mg/dL (ref 70–99)
Glucose-Capillary: 77 mg/dL (ref 70–99)
Glucose-Capillary: 84 mg/dL (ref 70–99)

## 2017-10-19 SURGERY — BREAST LUMPECTOMY WITH RADIOACTIVE SEED AND SENTINEL LYMPH NODE BIOPSY
Anesthesia: General | Site: Breast | Laterality: Right

## 2017-10-19 MED ORDER — SODIUM CHLORIDE 0.9% FLUSH: 3.0000 mL | INTRAVENOUS | Status: DC | PRN

## 2017-10-19 MED ORDER — ROCURONIUM BROMIDE 10 MG/ML (PF) SYRINGE
PREFILLED_SYRINGE | INTRAVENOUS | Status: DC | PRN
  Administered 2017-10-19: 50 mg via INTRAVENOUS

## 2017-10-19 MED ORDER — SODIUM CHLORIDE 0.9% FLUSH: 3.0000 mL | Freq: Two times a day (BID) | INTRAVENOUS | Status: DC

## 2017-10-19 MED ORDER — LIDOCAINE 2% (20 MG/ML) 5 ML SYRINGE
INTRAMUSCULAR | Status: DC | PRN
  Administered 2017-10-19: 50 mg via INTRAVENOUS

## 2017-10-19 MED ORDER — LACTATED RINGERS IV SOLN
INTRAVENOUS | Status: DC
  Administered 2017-10-19: 09:00:00 via INTRAVENOUS

## 2017-10-19 MED ORDER — SODIUM CHLORIDE 0.9 % IJ SOLN
INTRAVENOUS | Status: DC | PRN
  Administered 2017-10-19: 11:00:00 via INTRAMUSCULAR

## 2017-10-19 MED ORDER — DEXAMETHASONE SODIUM PHOSPHATE 10 MG/ML IJ SOLN
INTRAMUSCULAR | Status: AC
  Filled 2017-10-19: qty 1

## 2017-10-19 MED ORDER — BUPIVACAINE-EPINEPHRINE 0.25% -1:200000 IJ SOLN
INTRAMUSCULAR | Status: DC | PRN
  Administered 2017-10-19: 8 mL
  Administered 2017-10-19: 10 mL

## 2017-10-19 MED ORDER — DEXAMETHASONE SODIUM PHOSPHATE 10 MG/ML IJ SOLN
INTRAMUSCULAR | Status: DC | PRN
  Administered 2017-10-19: 10 mg via INTRAVENOUS

## 2017-10-19 MED ORDER — 0.9 % SODIUM CHLORIDE (POUR BTL) OPTIME
TOPICAL | Status: DC | PRN
  Administered 2017-10-19 (×2): 1000 mL

## 2017-10-19 MED ORDER — ACETAMINOPHEN 325 MG PO TABS: 650.0000 mg | ORAL_TABLET | ORAL | Status: DC | PRN

## 2017-10-19 MED ORDER — SUGAMMADEX SODIUM 200 MG/2ML IV SOLN
INTRAVENOUS | Status: DC | PRN
  Administered 2017-10-19: 200 mg via INTRAVENOUS

## 2017-10-19 MED ORDER — MIDAZOLAM HCL 2 MG/2ML IJ SOLN
INTRAMUSCULAR | Status: AC
  Administered 2017-10-19: 1 mg via INTRAVENOUS
  Filled 2017-10-19: qty 2

## 2017-10-19 MED ORDER — FENTANYL CITRATE (PF) 100 MCG/2ML IJ SOLN
INTRAMUSCULAR | Status: AC
  Administered 2017-10-19: 100 ug via INTRAVENOUS
  Filled 2017-10-19: qty 2

## 2017-10-19 MED ORDER — OXYCODONE HCL 5 MG PO TABS
5.0000 mg | ORAL_TABLET | Freq: Once | ORAL | Status: AC
  Administered 2017-10-19: 5 mg via ORAL

## 2017-10-19 MED ORDER — FENTANYL CITRATE (PF) 100 MCG/2ML IJ SOLN: 25.0000 ug | INTRAMUSCULAR | Status: DC | PRN

## 2017-10-19 MED ORDER — ACETAMINOPHEN 650 MG RE SUPP: 650.0000 mg | RECTAL | Status: DC | PRN

## 2017-10-19 MED ORDER — GABAPENTIN 300 MG PO CAPS
ORAL_CAPSULE | ORAL | Status: AC
  Administered 2017-10-19: 300 mg via ORAL
  Filled 2017-10-19: qty 1

## 2017-10-19 MED ORDER — MIDAZOLAM HCL 2 MG/2ML IJ SOLN
1.0000 mg | Freq: Once | INTRAMUSCULAR | Status: AC
  Administered 2017-10-19: 1 mg via INTRAVENOUS

## 2017-10-19 MED ORDER — CEFAZOLIN SODIUM-DEXTROSE 2-4 GM/100ML-% IV SOLN
INTRAVENOUS | Status: AC
  Filled 2017-10-19: qty 100

## 2017-10-19 MED ORDER — PROPOFOL 10 MG/ML IV BOLUS
INTRAVENOUS | Status: AC
  Filled 2017-10-19: qty 20

## 2017-10-19 MED ORDER — HYDROCODONE-ACETAMINOPHEN 5-325 MG PO TABS: 1.0000 | ORAL_TABLET | Freq: Four times a day (QID) | ORAL | 0 refills | Status: DC | PRN

## 2017-10-19 MED ORDER — SODIUM CHLORIDE 0.9 % IJ SOLN
INTRAMUSCULAR | Status: AC
  Filled 2017-10-19: qty 10

## 2017-10-19 MED ORDER — OXYCODONE HCL 5 MG PO TABS: 5.0000 mg | ORAL_TABLET | ORAL | Status: DC | PRN

## 2017-10-19 MED ORDER — FENTANYL CITRATE (PF) 250 MCG/5ML IJ SOLN
INTRAMUSCULAR | Status: AC
  Filled 2017-10-19: qty 5

## 2017-10-19 MED ORDER — ONDANSETRON HCL 4 MG/2ML IJ SOLN
INTRAMUSCULAR | Status: AC
  Filled 2017-10-19: qty 2

## 2017-10-19 MED ORDER — LACTATED RINGERS IV SOLN: INTRAVENOUS | Status: DC

## 2017-10-19 MED ORDER — SODIUM CHLORIDE 0.9 % IV SOLN: 250.0000 mL | INTRAVENOUS | Status: DC | PRN

## 2017-10-19 MED ORDER — OXYCODONE HCL 5 MG PO TABS
ORAL_TABLET | ORAL | Status: AC
  Filled 2017-10-19: qty 1

## 2017-10-19 MED ORDER — ROCURONIUM BROMIDE 50 MG/5ML IV SOSY
PREFILLED_SYRINGE | INTRAVENOUS | Status: AC
  Filled 2017-10-19: qty 5

## 2017-10-19 MED ORDER — METHYLENE BLUE 0.5 % INJ SOLN
INTRAVENOUS | Status: AC
  Filled 2017-10-19: qty 10

## 2017-10-19 MED ORDER — CEFAZOLIN SODIUM-DEXTROSE 2-4 GM/100ML-% IV SOLN
2.0000 g | INTRAVENOUS | Status: AC
  Administered 2017-10-19: 2 g via INTRAVENOUS

## 2017-10-19 MED ORDER — ACETAMINOPHEN 500 MG PO TABS
ORAL_TABLET | ORAL | Status: AC
  Administered 2017-10-19: 1000 mg via ORAL
  Filled 2017-10-19: qty 2

## 2017-10-19 MED ORDER — CHLORHEXIDINE GLUCONATE CLOTH 2 % EX PADS: 6.0000 | MEDICATED_PAD | Freq: Once | CUTANEOUS | Status: DC

## 2017-10-19 MED ORDER — ONDANSETRON HCL 4 MG/2ML IJ SOLN
INTRAMUSCULAR | Status: DC | PRN
  Administered 2017-10-19: 4 mg via INTRAVENOUS

## 2017-10-19 MED ORDER — PROPOFOL 10 MG/ML IV BOLUS
INTRAVENOUS | Status: DC | PRN
  Administered 2017-10-19: 150 mg via INTRAVENOUS

## 2017-10-19 MED ORDER — FENTANYL CITRATE (PF) 100 MCG/2ML IJ SOLN
100.0000 ug | Freq: Once | INTRAMUSCULAR | Status: AC
  Administered 2017-10-19: 100 ug via INTRAVENOUS

## 2017-10-19 MED ORDER — FENTANYL CITRATE (PF) 100 MCG/2ML IJ SOLN: 50.0000 ug | Freq: Once | INTRAMUSCULAR | Status: DC

## 2017-10-19 MED ORDER — BUPIVACAINE-EPINEPHRINE (PF) 0.25% -1:200000 IJ SOLN
INTRAMUSCULAR | Status: AC
  Filled 2017-10-19: qty 30

## 2017-10-19 MED ORDER — TECHNETIUM TC 99M SULFUR COLLOID FILTERED
1.0000 | Freq: Once | INTRAVENOUS | Status: AC | PRN
  Administered 2017-10-19: 1 via INTRADERMAL

## 2017-10-19 MED ORDER — GABAPENTIN 300 MG PO CAPS
300.0000 mg | ORAL_CAPSULE | ORAL | Status: AC
  Administered 2017-10-19: 300 mg via ORAL

## 2017-10-19 MED ORDER — LIDOCAINE 2% (20 MG/ML) 5 ML SYRINGE
INTRAMUSCULAR | Status: AC
  Filled 2017-10-19: qty 5

## 2017-10-19 MED ORDER — ACETAMINOPHEN 500 MG PO TABS
1000.0000 mg | ORAL_TABLET | ORAL | Status: AC
  Administered 2017-10-19: 1000 mg via ORAL

## 2017-10-19 SURGICAL SUPPLY — 51 items
ADH SKN CLS APL DERMABOND .7 (GAUZE/BANDAGES/DRESSINGS) ×1
APPLIER CLIP 9.375 MED OPEN (MISCELLANEOUS) ×3
APR CLP MED 9.3 20 MLT OPN (MISCELLANEOUS) ×1
BINDER BREAST XLRG (GAUZE/BANDAGES/DRESSINGS) ×2 IMPLANT
BLADE SURG 15 STRL LF DISP TIS (BLADE) ×2 IMPLANT
BLADE SURG 15 STRL SS (BLADE) ×6
CANISTER SUCT 3000ML PPV (MISCELLANEOUS) ×3 IMPLANT
CHLORAPREP W/TINT 26ML (MISCELLANEOUS) ×3 IMPLANT
CLIP APPLIE 9.375 MED OPEN (MISCELLANEOUS) ×1 IMPLANT
CONT SPEC 4OZ CLIKSEAL STRL BL (MISCELLANEOUS) ×6 IMPLANT
COVER PROBE W GEL 5X96 (DRAPES) ×3 IMPLANT
COVER SURGICAL LIGHT HANDLE (MISCELLANEOUS) ×3 IMPLANT
DERMABOND ADVANCED (GAUZE/BANDAGES/DRESSINGS) ×2
DERMABOND ADVANCED .7 DNX12 (GAUZE/BANDAGES/DRESSINGS) ×1 IMPLANT
DEVICE DUBIN SPECIMEN MAMMOGRA (MISCELLANEOUS) ×3 IMPLANT
DRAPE CHEST BREAST 15X10 FENES (DRAPES) ×3 IMPLANT
DRAPE HALF SHEET 40X57 (DRAPES) ×3 IMPLANT
DRAPE UTILITY XL STRL (DRAPES) ×3 IMPLANT
DRSG PAD ABDOMINAL 8X10 ST (GAUZE/BANDAGES/DRESSINGS) ×3 IMPLANT
ELECT CAUTERY BLADE 6.4 (BLADE) ×3 IMPLANT
ELECT REM PT RETURN 9FT ADLT (ELECTROSURGICAL) ×3
ELECTRODE REM PT RTRN 9FT ADLT (ELECTROSURGICAL) ×1 IMPLANT
FILTER STRAW FLUID ASPIR (MISCELLANEOUS) ×2 IMPLANT
GAUZE SPONGE 4X4 12PLY STRL (GAUZE/BANDAGES/DRESSINGS) ×2 IMPLANT
GLOVE EUDERMIC 7 POWDERFREE (GLOVE) ×5 IMPLANT
GLOVE SURG SS PI 6.5 STRL IVOR (GLOVE) ×2 IMPLANT
GOWN STRL REUS W/ TWL LRG LVL3 (GOWN DISPOSABLE) ×1 IMPLANT
GOWN STRL REUS W/ TWL XL LVL3 (GOWN DISPOSABLE) ×1 IMPLANT
GOWN STRL REUS W/TWL LRG LVL3 (GOWN DISPOSABLE) ×6
GOWN STRL REUS W/TWL XL LVL3 (GOWN DISPOSABLE) ×3
KIT BASIN OR (CUSTOM PROCEDURE TRAY) ×3 IMPLANT
KIT MARKER MARGIN INK (KITS) ×3 IMPLANT
NDL HYPO 25GX1X1/2 BEV (NEEDLE) ×1 IMPLANT
NDL SAFETY ECLIPSE 18X1.5 (NEEDLE) IMPLANT
NEEDLE HYPO 18GX1.5 SHARP (NEEDLE) ×3
NEEDLE HYPO 25GX1X1/2 BEV (NEEDLE) ×3 IMPLANT
NS IRRIG 1000ML POUR BTL (IV SOLUTION) ×5 IMPLANT
PACK SURGICAL SETUP 50X90 (CUSTOM PROCEDURE TRAY) ×3 IMPLANT
PAD ABD 8X10 STRL (GAUZE/BANDAGES/DRESSINGS) ×2 IMPLANT
PENCIL BUTTON HOLSTER BLD 10FT (ELECTRODE) ×3 IMPLANT
SPONGE LAP 4X18 RFD (DISPOSABLE) ×3 IMPLANT
SUT MNCRL AB 4-0 PS2 18 (SUTURE) ×6 IMPLANT
SUT SILK 2 0 SH (SUTURE) ×3 IMPLANT
SUT VIC AB 2-0 CT1 27 (SUTURE) ×6
SUT VIC AB 2-0 CT1 TAPERPNT 27 (SUTURE) IMPLANT
SUT VIC AB 3-0 SH 18 (SUTURE) ×3 IMPLANT
SYR BULB 3OZ (MISCELLANEOUS) ×3 IMPLANT
SYR CONTROL 10ML LL (SYRINGE) ×3 IMPLANT
TUBE CONNECTING 12'X1/4 (SUCTIONS) ×1
TUBE CONNECTING 12X1/4 (SUCTIONS) ×2 IMPLANT
YANKAUER SUCT BULB TIP NO VENT (SUCTIONS) ×3 IMPLANT

## 2017-10-19 NOTE — Discharge Instructions (Signed)
[facility] °Office Phone Number [phone] ° °BREAST BIOPSY/ PARTIAL MASTECTOMY: POST OP INSTRUCTIONS ° °Always review your discharge instruction sheet given to you by the facility where your surgery was performed. ° °IF YOU HAVE DISABILITY OR FAMILY LEAVE FORMS, YOU MUST BRING THEM TO THE OFFICE FOR PROCESSING.  DO NOT GIVE THEM TO YOUR DOCTOR. ° °1. A prescription for pain medication may be given to you upon discharge.  Take your pain medication as prescribed, if needed.  If narcotic pain medicine is not needed, then you may take acetaminophen (Tylenol) or ibuprofen (Advil) as needed. °2. Take your usually prescribed medications unless otherwise directed °3. If you need a refill on your pain medication, please contact your pharmacy.  They will contact our office to request authorization.  Prescriptions will not be filled after 5pm or on week-ends. °4. You should eat very light the first 24 hours after surgery, such as soup, crackers, pudding, etc.  Resume your normal diet the day after surgery. °5. Most patients will experience some swelling and bruising in the breast.  Ice packs and a good support bra will help.  Swelling and bruising can take several days to resolve.  °6. It is common to experience some constipation if taking pain medication after surgery.  Increasing fluid intake and taking a stool softener will usually help or prevent this problem from occurring.  A mild laxative (Milk of Magnesia or Miralax) should be taken according to package directions if there are no bowel movements after 48 hours. °7. Unless discharge instructions indicate otherwise, you may remove your bandages 24-48 hours after surgery, and you may shower at that time.  You may have steri-strips (small skin tapes) in place directly over the incision.  These strips should be left on the skin for 7-10 days.  If your surgeon used skin glue on the incision, you may shower in 24 hours.  The glue will flake off over the  next 2-3 weeks.  Any sutures or staples will be removed at the office during your follow-up visit. °8. ACTIVITIES:  You may resume regular daily activities (gradually increasing) beginning the next day.  Wearing a good support bra or sports bra minimizes pain and swelling.  You may have sexual intercourse when it is comfortable. °a. You may drive when you no longer are taking prescription pain medication, you can comfortably wear a seatbelt, and you can safely maneuver your car and apply brakes. °b. RETURN TO WORK:  ______________________________________________________________________________________ °9. You should see your doctor in the office for a follow-up appointment approximately two weeks after your surgery.  Your doctor’s nurse will typically make your follow-up appointment when she calls you with your pathology report.  Expect your pathology report 2-3 business days after your surgery.  You may call to check if you do not hear from us after three days. °10. OTHER INSTRUCTIONS: _______________________________________________________________________________________________ _____________________________________________________________________________________________________________________________________ °_____________________________________________________________________________________________________________________________________ °_____________________________________________________________________________________________________________________________________ ° °WHEN TO CALL YOUR DOCTOR: °1. Fever over 101.0 °2. Nausea and/or vomiting. °3. Extreme swelling or bruising. °4. Continued bleeding from incision. °5. Increased pain, redness, or drainage from the incision. ° °The clinic staff is available to answer your questions during regular business hours.  Please don’t hesitate to call and ask to speak to one of the nurses for clinical concerns.  If you have a medical emergency, go to the nearest  emergency room or call 911.  A surgeon from [facility] is always on call at the hospital. ° °For further questions, please visit [email or web address]  °

## 2017-10-19 NOTE — Anesthesia Procedure Notes (Signed)
Procedure Name: Intubation Date/Time: 10/19/2017 10:45 AM Performed by: Teressa Lower., CRNA Pre-anesthesia Checklist: Patient identified, Emergency Drugs available, Suction available and Patient being monitored Patient Re-evaluated:Patient Re-evaluated prior to induction Oxygen Delivery Method: Circle system utilized Preoxygenation: Pre-oxygenation with 100% oxygen Induction Type: IV induction Ventilation: Mask ventilation without difficulty Laryngoscope Size: Miller and 2 Grade View: Grade I Tube type: Oral Tube size: 7.0 mm Number of attempts: 1 Airway Equipment and Method: Stylet Placement Confirmation: ETT inserted through vocal cords under direct vision,  positive ETCO2 and breath sounds checked- equal and bilateral Secured at: 21 cm Tube secured with: Tape Dental Injury: Teeth and Oropharynx as per pre-operative assessment

## 2017-10-19 NOTE — Anesthesia Postprocedure Evaluation (Signed)
Anesthesia Post Note  Patient: Sheena Joyce  Procedure(s) Performed: Reyne Dumas BLUE DYE RIGHT BREAST AND RIGHT BREAST LUMPECTOMY WITH TWO RADIOACTIVE SEEDS AND RIGHT AXILLARY SENTINEL LYMPH NODE BIOPSY (Right Breast)     Patient location during evaluation: PACU Anesthesia Type: General Level of consciousness: awake and alert Pain management: pain level controlled Vital Signs Assessment: post-procedure vital signs reviewed and stable Respiratory status: spontaneous breathing, nonlabored ventilation and respiratory function stable Cardiovascular status: blood pressure returned to baseline Anesthetic complications: no    Last Vitals:  Vitals:   10/19/17 1309 10/19/17 1320  BP: 115/75 125/68  Pulse: 70 77  Resp: (!) 24 18  Temp:  36.6 C  SpO2: 96% 94%    Last Pain:  Vitals:   10/19/17 1320  TempSrc:   PainSc: 2                  Sulma Ruffino COKER

## 2017-10-19 NOTE — Interval H&P Note (Signed)
History and Physical Interval Note:  10/19/2017 9:11 AM  Sheena Joyce  has presented today for surgery, with the diagnosis of MULTIFOCAL CANCER RIGHT BREAST  The various methods of treatment have been discussed with the patient and family. After consideration of risks, benefits and other options for treatment, the patient has consented to  Procedure(s): INJECT BLUE DYE RIGHT BREAST AND RIGHT BREAST LUMPECTOMY X'S 2 WITH RADIOACTIVE SEED X'S 2 AND RIGHT AXILLARY SENTINEL LYMPH NODE BIOPSY (Right) as a surgical intervention .  The patient's history has been reviewed, patient examined, no change in status, stable for surgery.  I have reviewed the patient's chart and labs.  Questions were answered to the patient's satisfaction.     [name]

## 2017-10-19 NOTE — Anesthesia Procedure Notes (Addendum)
Anesthesia Regional Block: Pectoralis block   Pre-Anesthetic Checklist: ,, timeout performed, Correct Patient, Correct Site, Correct Laterality, Correct Procedure, Correct Position, site marked, Risks and benefits discussed,  Surgical consent,  Pre-op evaluation,  At surgeon's request and post-op pain management  Laterality: Right  Prep: chloraprep       Needles:  Injection technique: Single-shot  Needle Type: Echogenic Needle     Needle Length: 9cm  Needle Gauge: 21     Additional Needles:   Narrative:  Start time: 10/19/2017 9:50 AM End time: 10/19/2017 10:40 AM Injection made incrementally with aspirations every 5 mL.  Performed by: Personally  Anesthesiologist: Roberts Gaudy, MD  Additional Notes: 20 cc 0.75% Naropin with 0.3 mg clonidine

## 2017-10-19 NOTE — Op Note (Signed)
Patient Name:           Sheena Joyce   Date of Surgery:        10/19/2017  Pre op Diagnosis:      Multifocal cancer, superior central right breast  Post op Diagnosis:    Same  Procedure:                 Inject blue dye right breast                                      Right breast lumpectomy x2 with radioactive seed localization x2                                      Right axillary deep sentinel lymph nodbiopsy  Surgeon:                     Sheena Petrin. Dalbert Joyce, M.D., FACS  Assistant:                      OR staff  Operative Indications:   This is a 72 year old female, referred to me by Dr. Jeanmarie Joyce in radiology for evaluation of invasive lobular carcinoma right breast. Sheena Joyce is her PCP.        She felt a little thickening in the right breast above the areolar margin. Bilateral mammograms were performed on Jul 15, 2017. Follow-up imaging was performed on August 23, 2017. The left breast looks normal. In the right breast there are 2 masses at the 12 o'clock position. A 1.1 cm mass 2 cm from the nipple and the 1.3 cm mass 4 cm from the nipple. The right axilla looks normal. Image guided biopsy of both masses was performed. Both biopsies showed invasive lobular carcinoma. Grade 1. ER 100%. PR 95%. ki- 67 1%. HER-2 negative.  Family history is negative for breast or ovarian cancer. Mother died age 49 had a stroke. Father died age 69 of heart disease. Sister was treated for leukemia. Brother had lung cancer with metastasis.       We had a long discussion. I explained the location and size of her tumors. I was hopeful that we will be able to do breast conservation surgery but we will have to do further evaluation.s       MRI shows adjacent enhancing masses in the slightly upper inner right breast consistent with known biopsy-proven invasive lobular carcinoma. The biopsy clips are approximately 3 cm apart. There is no MRI evidence elsewhere in the right breast  or left breast or any of the lymph nodes She has seen Dr. Orlene Joyce and Dr. Hinton Joyce in [place].  She will be scheduled for right breast lumpectomy 2 with radioactive seed localization 2 and right axillary deep sentinel lymph node biopsy  Operative Findings:       The 2 cancers were visualized radiographically at the 12 o'clock position of the right breast.  We were able to do the lumpectomy with a single transverse curvilinear incision and the specimen mammogram looked good with the radioactive seeds in the original biopsy clips in the relative center of the specimen.  It looks like we had excellent margins.  I found 4 sentinel lymph nodes.  Procedure in Detail:  The patient underwent injection of radionuclide into the right breast by the nuclear medicine technician.  The patient was taken to the operating room and underwent general endotracheal anesthesia.  Surgical timeout was performed.  Intravenous antibiotics were given.  Following alcohol prep I injected 5 cc of dilute methylene blue into the right breast, subareolar area, and massaged the breast for a few minutes.  The entire right chest wall and axilla were then prepped and draped in a sterile fashion.  0.5% Marcaine with epinephrine was used as local infiltration anesthetic.      Using the neoprobe I isolated the radioactive seeds at the 12 o'clock position on the right.  I chose a transverse curvilinear incision.  The lumpectomy was performed with electrocautery and the neoprobe.  The specimen was removed and marked with silk sutures and a 6 color ink kit to orient the pathologist.  The specimen mammogram looked very good as described above.  Specimen was sent to the lab where the seeds were retrieved.  Hemostasis was excellent.  The wound was irrigated.  5 metal marker clips were placed in the walls of the lumpectomy cavity.  The lumpectomy cavity was relatively large and was closed with 2-0 Vicryl sutures, 3-0 Vicryl sutures and the skin  was closed with a running subcuticular 4-0 Monocryl and Dermabond.       With the neoprobe reset to technetium attention was directed to the right axilla where a transverse incision was made in the hairline.  Dissection was carried down through the clavipectoral fascia.  Using the neoprobe I found 4 sentinel lymph nodes and these were sent as separate specimens.  Hemostasis was excellent.  The wound was irrigated.  The clavipectoral fascia was closed with 3-0 Vicryl sutures and the skin closed with a running subcuticular 4-0 Monocryl and Dermabond.  Clean bandages and a breast binder were placed.  The patient tolerated the procedure well and was taken to PACU in stable condition.  EBL 25 cc.  Counts correct.  Complications none.    Addendum: I logged onto the [employer] and reviewed her prescription medication history     Sheena Joyce M. Dalbert Joyce, M.D., FACS General and Minimally Invasive Surgery Breast and Colorectal Surgery  10/19/2017 11:38 AM

## 2017-10-19 NOTE — Transfer of Care (Signed)
Immediate Anesthesia Transfer of Care Note  Patient: Sheena Joyce  Procedure(s) Performed: Reyne Dumas BLUE DYE RIGHT BREAST AND RIGHT BREAST LUMPECTOMY WITH TWO RADIOACTIVE SEEDS AND RIGHT AXILLARY SENTINEL LYMPH NODE BIOPSY (Right Breast)  Patient Location: PACU  Anesthesia Type:GA combined with regional for post-op pain  Level of Consciousness: awake, alert  and oriented  Airway & Oxygen Therapy: Patient Spontanous Breathing and Patient connected to face mask oxygen  Post-op Assessment: Report given to RN and Post -op Vital signs reviewed and stable  Post vital signs: Reviewed and stable  Last Vitals:  Vitals Value Taken Time  BP 153/93 10/19/2017 11:54 AM  Temp    Pulse 74 10/19/2017 11:57 AM  Resp 24 10/19/2017 11:57 AM  SpO2 95 % 10/19/2017 11:57 AM  Vitals shown include unvalidated device data.  Last Pain:  Vitals:   10/19/17 0823  TempSrc:   PainSc: 0-No pain      Patients Stated Pain Goal: 0 (07/09/00 1117)  Complications: No apparent anesthesia complications

## 2017-10-19 NOTE — Anesthesia Preprocedure Evaluation (Signed)
Anesthesia Evaluation  Patient identified by MRN, date of birth, ID band Patient awake    Reviewed: Allergy & Precautions, NPO status , Patient's Chart, lab work & pertinent test results  Airway Mallampati: II  TM Distance: >3 FB Neck ROM: Full    Dental  (+) Teeth Intact, Dental Advisory Given   Pulmonary    breath sounds clear to auscultation       Cardiovascular  Rhythm:Regular Rate:Normal     Neuro/Psych    GI/Hepatic   Endo/Other  diabetes  Renal/GU      Musculoskeletal   Abdominal   Peds  Hematology   Anesthesia Other Findings   Reproductive/Obstetrics                             Anesthesia Physical Anesthesia Plan  ASA: II  Anesthesia Plan: General   Post-op Pain Management:  Regional for Post-op pain   Induction: Intravenous  PONV Risk Score and Plan: Ondansetron and Dexamethasone  Airway Management Planned: Oral ETT  Additional Equipment:   Intra-op Plan:   Post-operative Plan: Extubation in OR  Informed Consent: I have reviewed the patients History and Physical, chart, labs and discussed the procedure including the risks, benefits and alternatives for the proposed anesthesia with the patient or authorized representative who has indicated his/her understanding and acceptance.   Dental advisory given  Plan Discussed with: CRNA and Anesthesiologist  Anesthesia Plan Comments:         Anesthesia Quick Evaluation

## 2017-10-20 ENCOUNTER — Encounter (HOSPITAL_COMMUNITY): Payer: Self-pay | Admitting: General Surgery

## 2017-11-02 DIAGNOSIS — C50111 Malignant neoplasm of central portion of right female breast: Secondary | ICD-10-CM | POA: Diagnosis not present

## 2017-11-02 DIAGNOSIS — Z170 Estrogen receptor positive status [ER+]: Secondary | ICD-10-CM | POA: Diagnosis not present

## 2017-11-02 DIAGNOSIS — C50811 Malignant neoplasm of overlapping sites of right female breast: Secondary | ICD-10-CM | POA: Diagnosis not present

## 2017-11-09 DIAGNOSIS — G43709 Chronic migraine without aura, not intractable, without status migrainosus: Secondary | ICD-10-CM | POA: Diagnosis not present

## 2017-11-09 DIAGNOSIS — E785 Hyperlipidemia, unspecified: Secondary | ICD-10-CM | POA: Diagnosis not present

## 2017-11-09 DIAGNOSIS — Z6838 Body mass index (BMI) 38.0-38.9, adult: Secondary | ICD-10-CM | POA: Diagnosis not present

## 2017-11-09 DIAGNOSIS — E669 Obesity, unspecified: Secondary | ICD-10-CM | POA: Diagnosis not present

## 2017-11-09 DIAGNOSIS — C50911 Malignant neoplasm of unspecified site of right female breast: Secondary | ICD-10-CM | POA: Diagnosis not present

## 2017-11-09 DIAGNOSIS — M542 Cervicalgia: Secondary | ICD-10-CM | POA: Diagnosis not present

## 2017-11-23 ENCOUNTER — Encounter (HOSPITAL_COMMUNITY): Payer: Self-pay | Admitting: Oncology

## 2017-12-01 DIAGNOSIS — Z8042 Family history of malignant neoplasm of prostate: Secondary | ICD-10-CM | POA: Diagnosis not present

## 2017-12-01 DIAGNOSIS — C50811 Malignant neoplasm of overlapping sites of right female breast: Secondary | ICD-10-CM | POA: Diagnosis not present

## 2017-12-01 DIAGNOSIS — Z801 Family history of malignant neoplasm of trachea, bronchus and lung: Secondary | ICD-10-CM

## 2017-12-01 DIAGNOSIS — C50111 Malignant neoplasm of central portion of right female breast: Secondary | ICD-10-CM | POA: Diagnosis not present

## 2017-12-01 DIAGNOSIS — M810 Age-related osteoporosis without current pathological fracture: Secondary | ICD-10-CM | POA: Diagnosis not present

## 2017-12-01 DIAGNOSIS — Z170 Estrogen receptor positive status [ER+]: Secondary | ICD-10-CM | POA: Diagnosis not present

## 2017-12-01 DIAGNOSIS — Z806 Family history of leukemia: Secondary | ICD-10-CM | POA: Diagnosis not present

## 2017-12-01 DIAGNOSIS — C50911 Malignant neoplasm of unspecified site of right female breast: Secondary | ICD-10-CM | POA: Diagnosis not present

## 2017-12-01 DIAGNOSIS — Z803 Family history of malignant neoplasm of breast: Secondary | ICD-10-CM | POA: Diagnosis not present

## 2017-12-05 ENCOUNTER — Encounter (HOSPITAL_COMMUNITY): Payer: Self-pay | Admitting: *Deleted

## 2017-12-05 ENCOUNTER — Other Ambulatory Visit: Payer: Self-pay | Admitting: General Surgery

## 2017-12-05 ENCOUNTER — Other Ambulatory Visit: Payer: Self-pay

## 2017-12-06 ENCOUNTER — Encounter (HOSPITAL_COMMUNITY): Payer: Self-pay | Admitting: *Deleted

## 2017-12-06 ENCOUNTER — Other Ambulatory Visit: Payer: Self-pay

## 2017-12-06 NOTE — H&P (Signed)
Merril Abbe Location: [facility] Patient #: [id number] DOB: 11-Sep-1945 Married / Language: English / Race: White Female      History of Present Illness The patient is a 72 year old female presenting for a post-operative visit. This is a very pleasant, healthy 71 year old woman from [place] She is here with her husband throughout the encounter. Dr. Hinton Rao and Dr. Orlene Erm are involved in her care. Her PCP is Dr. [name].  On October 19, 2017 showed underwent right breast lumpectomy with 2 radioactive seeds and sentinel lymph node biopsy. Final pathology shows invasive lobular carcinoma right breast, 4.5 cm. Margins are negative. 2 out of 4 sentinel lymph nodes have micrometastatic deposit. Hormone receptor strongly positive. HER-2 negative. She is doing well and has no complaints about her breast or arm She has seen Dr. Hinton Rao in [place].  She recommends Port-A-Cath insertion to facilitate chemotherapy.  She will clearly need antiestrogen therapy long-term Whole breast radiation therapy, possibly with tangents, should be considered  Plan: Right shoulder range of motion exercises discussed Genomic testing on cancer cells pending Follow-up with Dr. Hinton Rao and Dr. Orlene Erm to decide regarding benefits of chemotherapy and whole breast radiation therapy with tangents I discussed Port-A-Cath insertion with her and her husband Return to see me in 6 weeks   Addendum Note(Leita Lindbloom M. Dalbert Batman MD; 11/16/2017 7:08 AM) Presented Breast conference today Consensus recommendations are to proceed with Oncotype testing to assess benefit of chemotherapy. Adjuvant radiation therapy advised   Allergies  No Known Drug Allergies  Allergies Reconciled   Medication History . Venlafaxine HCl (75MG Tablet, Oral) Active. Simvastatin (20MG Tablet, Oral) Active. Rizatriptan Benzoate (10MG Tablet, Oral) Active. Medications Reconciled  Vitals  Weight: 171.5 lb  Height: 60in Body Surface Area: 1.75 m Body Mass Index: 33.49 kg/m  Temp.: 98.74F  Pulse: 81 (Regular)  Resp.: 18 (Unlabored)  P.OX: 98% (Room air) BP: 122/82 (Sitting, Left Arm, Standard)     Physical Exam  General Note: Pleasant. Cooperative. No distress. Anxious but no more than usual. Lots of questions. Husband present.  Lungs: Clear to auscultation bilaterally Cardiovascular: Regular rate and rhythm.  No ectopy.  Radial and femoral pulses palble Abdomen: Soft and nontender.  No mass or organomegaly Neck: No adenopathy or mass supple.   Breast Note: Right breast lumpectomy incision, transverse curvilinear, 12:00 and right axillary incision healing nicely. No hematoma or infection. Tissue soft. No hematoma. Good range of motion right shoulder. No sensory deficit or arm swelling.     Assessment & Plan  CANCER OF CENTRAL PORTION OF RIGHT BREAST (C50.111) .  You are recovering from your right breast lumpectomy and sentinel lymph node biopsy without any obvious surgical complications The wounds are healing well without infection or hematoma Be sure to move your right shoulder around several times a day and stretch it as much as possible  You have seen Dr. Hinton Rao down in [place] and she has reviewed your pathology Your breast cancer was 4.5 cm in diameter. Invasive lobular carcinoma. Completely removed with negative margins. 2 out of 4 lymph nodes had microscopic cancer deposits in them Estrogen and progesterone receptors are positive You will not need any further breast or lymph node surgery.  Dr. Hinton Rao as requested Port-A-Cath insertion to facilitate chemotherapy.we have discussed the indications, techniques, and risks of that surgery in detail.  Dr. Dalbert Batman will perform the Port-A-Cath insertion  Ultimately Dr. Hinton Rao will place you on antiestrogen hormonal therapy  It would be my advice for you to seek consultation  with Dr. Orlene Erm regarding whole  breast radiation therapy He and Dr. Hinton Rao can then formulate a comprehensive treatment plan  Return to see Dr. Dalbert Batman in 6 weeks  S/P BREAST LUMPECTOMY (Z98.890) HYPERLIPIDEMIA, ACQUIRED (E78.5) DEPRESSION, CONTROLLED (F32.9) BMI 34.0-34.9,ADULT (Z68.34) HISTORY OF DIVERTICULITIS OF COLON (Z87.19)    Mckinzi Eriksen M. Dalbert Batman, M.D., [facility] General and Minimally invasive Surgery Breast and Colorectal Surgery Office:   [phone] Pager:   [phone]

## 2017-12-07 ENCOUNTER — Encounter (HOSPITAL_COMMUNITY)
Admission: RE | Disposition: A | Discharge status: Home / Self Care | Payer: Self-pay | Source: Ambulatory Visit | Attending: General Surgery

## 2017-12-07 ENCOUNTER — Encounter (HOSPITAL_COMMUNITY): Payer: Self-pay

## 2017-12-07 ENCOUNTER — Ambulatory Visit (HOSPITAL_COMMUNITY): Payer: PPO

## 2017-12-07 ENCOUNTER — Ambulatory Visit (HOSPITAL_COMMUNITY): Payer: PPO | Admitting: Anesthesiology

## 2017-12-07 ENCOUNTER — Ambulatory Visit (HOSPITAL_COMMUNITY)
Admission: RE | Admit: 2017-12-07 | Discharge: 2017-12-07 | Disposition: A | Discharge status: Home / Self Care | Payer: PPO | Source: Ambulatory Visit | Attending: General Surgery | Admitting: General Surgery

## 2017-12-07 DIAGNOSIS — K219 Gastro-esophageal reflux disease without esophagitis: Secondary | ICD-10-CM | POA: Diagnosis not present

## 2017-12-07 DIAGNOSIS — C50111 Malignant neoplasm of central portion of right female breast: Secondary | ICD-10-CM | POA: Insufficient documentation

## 2017-12-07 DIAGNOSIS — F419 Anxiety disorder, unspecified: Secondary | ICD-10-CM | POA: Insufficient documentation

## 2017-12-07 DIAGNOSIS — C50811 Malignant neoplasm of overlapping sites of right female breast: Secondary | ICD-10-CM | POA: Diagnosis present

## 2017-12-07 DIAGNOSIS — Z79899 Other long term (current) drug therapy: Secondary | ICD-10-CM | POA: Diagnosis not present

## 2017-12-07 DIAGNOSIS — M1990 Unspecified osteoarthritis, unspecified site: Secondary | ICD-10-CM | POA: Insufficient documentation

## 2017-12-07 DIAGNOSIS — E785 Hyperlipidemia, unspecified: Secondary | ICD-10-CM | POA: Diagnosis not present

## 2017-12-07 DIAGNOSIS — Z885 Allergy status to narcotic agent status: Secondary | ICD-10-CM | POA: Diagnosis not present

## 2017-12-07 DIAGNOSIS — Z8719 Personal history of other diseases of the digestive system: Secondary | ICD-10-CM | POA: Insufficient documentation

## 2017-12-07 DIAGNOSIS — F329 Major depressive disorder, single episode, unspecified: Secondary | ICD-10-CM | POA: Insufficient documentation

## 2017-12-07 DIAGNOSIS — E669 Obesity, unspecified: Secondary | ICD-10-CM | POA: Insufficient documentation

## 2017-12-07 DIAGNOSIS — Z6833 Body mass index (BMI) 33.0-33.9, adult: Secondary | ICD-10-CM | POA: Insufficient documentation

## 2017-12-07 DIAGNOSIS — Z95828 Presence of other vascular implants and grafts: Secondary | ICD-10-CM

## 2017-12-07 DIAGNOSIS — E119 Type 2 diabetes mellitus without complications: Secondary | ICD-10-CM | POA: Insufficient documentation

## 2017-12-07 DIAGNOSIS — Z452 Encounter for adjustment and management of vascular access device: Secondary | ICD-10-CM | POA: Diagnosis not present

## 2017-12-07 HISTORY — DX: Gastro-esophageal reflux disease without esophagitis: K21.9

## 2017-12-07 HISTORY — DX: Other specified postprocedural states: Z98.890

## 2017-12-07 HISTORY — DX: Family history of other specified conditions: Z84.89

## 2017-12-07 HISTORY — PX: PORTACATH PLACEMENT: SHX2246

## 2017-12-07 HISTORY — DX: Nausea with vomiting, unspecified: R11.2

## 2017-12-07 LAB — BASIC METABOLIC PANEL
ANION GAP: 8 (ref 5–15)
BUN: 13 mg/dL (ref 8–23)
CHLORIDE: 106 mmol/L (ref 98–111)
CO2: 24 mmol/L (ref 22–32)
Calcium: 8.8 mg/dL — ABNORMAL LOW (ref 8.9–10.3)
Creatinine, Ser: 0.89 mg/dL (ref 0.44–1.00)
GFR calc non Af Amer: >60 mL/min (ref >60)
Glucose, Bld: 115 mg/dL — ABNORMAL HIGH (ref 70–99)
Potassium: 3.8 mmol/L (ref 3.5–5.1)
Sodium: 138 mmol/L (ref 135–145)

## 2017-12-07 LAB — CBC
HCT: 40.6 % (ref 36.0–46.0)
Hemoglobin: 12.8 g/dL (ref 12.0–15.0)
MCH: 29.5 pg (ref 26.0–34.0)
MCHC: 31.5 g/dL (ref 30.0–36.0)
MCV: 93.5 fL (ref 80.0–100.0)
PLATELETS: 296 10*3/uL (ref 150–400)
RBC: 4.34 MIL/uL (ref 3.87–5.11)
RDW: 12.9 % (ref 11.5–15.5)
WBC: 5.3 10*3/uL (ref 4.0–10.5)
nRBC: 0 % (ref 0.0–0.2)

## 2017-12-07 LAB — GLUCOSE, CAPILLARY: GLUCOSE-CAPILLARY: 77 mg/dL (ref 70–99)

## 2017-12-07 SURGERY — INSERTION, TUNNELED CENTRAL VENOUS DEVICE, WITH PORT
Anesthesia: General | Laterality: Left

## 2017-12-07 MED ORDER — HEPARIN SOD (PORK) LOCK FLUSH 100 UNIT/ML IV SOLN
INTRAVENOUS | Status: DC | PRN
  Administered 2017-12-07: 500 [IU]

## 2017-12-07 MED ORDER — FENTANYL CITRATE (PF) 100 MCG/2ML IJ SOLN
INTRAMUSCULAR | Status: DC | PRN
  Administered 2017-12-07: 25 ug via INTRAVENOUS

## 2017-12-07 MED ORDER — FENTANYL CITRATE (PF) 100 MCG/2ML IJ SOLN: 25.0000 ug | INTRAMUSCULAR | Status: DC | PRN

## 2017-12-07 MED ORDER — EPHEDRINE 5 MG/ML INJ
INTRAVENOUS | Status: AC
  Filled 2017-12-07: qty 10

## 2017-12-07 MED ORDER — ACETAMINOPHEN 500 MG PO TABS
1000.0000 mg | ORAL_TABLET | ORAL | Status: AC
  Administered 2017-12-07: 1000 mg via ORAL
  Filled 2017-12-07: qty 2

## 2017-12-07 MED ORDER — METOCLOPRAMIDE HCL 5 MG/ML IJ SOLN: 10.0000 mg | Freq: Once | INTRAMUSCULAR | Status: DC | PRN

## 2017-12-07 MED ORDER — BUPIVACAINE-EPINEPHRINE (PF) 0.5% -1:200000 IJ SOLN
INTRAMUSCULAR | Status: AC
  Filled 2017-12-07: qty 30

## 2017-12-07 MED ORDER — LIDOCAINE 2% (20 MG/ML) 5 ML SYRINGE
INTRAMUSCULAR | Status: DC | PRN
  Administered 2017-12-07: 60 mg via INTRAVENOUS

## 2017-12-07 MED ORDER — FENTANYL CITRATE (PF) 100 MCG/2ML IJ SOLN
INTRAMUSCULAR | Status: AC
  Filled 2017-12-07: qty 2

## 2017-12-07 MED ORDER — CEFAZOLIN SODIUM-DEXTROSE 2-4 GM/100ML-% IV SOLN
2.0000 g | INTRAVENOUS | Status: AC
  Administered 2017-12-07: 2 g via INTRAVENOUS
  Filled 2017-12-07: qty 100

## 2017-12-07 MED ORDER — LACTATED RINGERS IV SOLN: INTRAVENOUS | Status: DC

## 2017-12-07 MED ORDER — SODIUM CHLORIDE 0.9% FLUSH: 3.0000 mL | Freq: Two times a day (BID) | INTRAVENOUS | Status: DC

## 2017-12-07 MED ORDER — BUPIVACAINE-EPINEPHRINE (PF) 0.5% -1:200000 IJ SOLN
INTRAMUSCULAR | Status: DC | PRN
  Administered 2017-12-07: 13 mL

## 2017-12-07 MED ORDER — ONDANSETRON HCL 4 MG/2ML IJ SOLN
INTRAMUSCULAR | Status: AC
  Filled 2017-12-07: qty 2

## 2017-12-07 MED ORDER — MEPERIDINE HCL 50 MG/ML IJ SOLN: 6.2500 mg | INTRAMUSCULAR | Status: DC | PRN

## 2017-12-07 MED ORDER — HEPARIN SOD (PORK) LOCK FLUSH 100 UNIT/ML IV SOLN
INTRAVENOUS | Status: AC
  Filled 2017-12-07: qty 5

## 2017-12-07 MED ORDER — ONDANSETRON HCL 4 MG/2ML IJ SOLN
INTRAMUSCULAR | Status: DC | PRN
  Administered 2017-12-07: 4 mg via INTRAVENOUS

## 2017-12-07 MED ORDER — EPHEDRINE SULFATE-NACL 50-0.9 MG/10ML-% IV SOSY
PREFILLED_SYRINGE | INTRAVENOUS | Status: DC | PRN
  Administered 2017-12-07: 10 mg via INTRAVENOUS

## 2017-12-07 MED ORDER — PROPOFOL 10 MG/ML IV BOLUS
INTRAVENOUS | Status: AC
  Filled 2017-12-07: qty 20

## 2017-12-07 MED ORDER — LACTATED RINGERS IV SOLN
INTRAVENOUS | Status: DC
  Administered 2017-12-07: 1000 mL via INTRAVENOUS

## 2017-12-07 MED ORDER — PROPOFOL 10 MG/ML IV BOLUS
INTRAVENOUS | Status: DC | PRN
  Administered 2017-12-07: 150 mg via INTRAVENOUS

## 2017-12-07 MED ORDER — ONDANSETRON HCL 4 MG/2ML IJ SOLN: 4.0000 mg | Freq: Once | INTRAMUSCULAR | Status: DC | PRN

## 2017-12-07 MED ORDER — SODIUM CHLORIDE 0.9 % IV SOLN
Freq: Once | INTRAVENOUS | Status: AC
  Administered 2017-12-07: 500 mL
  Filled 2017-12-07: qty 1.2

## 2017-12-07 MED ORDER — SODIUM CHLORIDE 0.9% FLUSH: 3.0000 mL | INTRAVENOUS | Status: DC | PRN

## 2017-12-07 MED ORDER — CHLORHEXIDINE GLUCONATE CLOTH 2 % EX PADS: 6.0000 | MEDICATED_PAD | Freq: Once | CUTANEOUS | Status: DC

## 2017-12-07 MED ORDER — SODIUM CHLORIDE 0.9 % IV SOLN: 250.0000 mL | INTRAVENOUS | Status: DC | PRN

## 2017-12-07 MED ORDER — LIDOCAINE HCL (CARDIAC) PF 100 MG/5ML IV SOSY
PREFILLED_SYRINGE | INTRAVENOUS | Status: AC
  Filled 2017-12-07: qty 5

## 2017-12-07 MED ORDER — GABAPENTIN 300 MG PO CAPS
300.0000 mg | ORAL_CAPSULE | ORAL | Status: AC
  Administered 2017-12-07: 300 mg via ORAL
  Filled 2017-12-07: qty 1

## 2017-12-07 MED ORDER — ACETAMINOPHEN 325 MG PO TABS: 650.0000 mg | ORAL_TABLET | ORAL | Status: DC | PRN

## 2017-12-07 MED ORDER — ACETAMINOPHEN 650 MG RE SUPP
650.0000 mg | RECTAL | Status: DC | PRN
  Filled 2017-12-07: qty 1

## 2017-12-07 MED ORDER — OXYCODONE HCL 5 MG PO TABS: 5.0000 mg | ORAL_TABLET | ORAL | Status: DC | PRN

## 2017-12-07 MED ORDER — DEXAMETHASONE SODIUM PHOSPHATE 10 MG/ML IJ SOLN
INTRAMUSCULAR | Status: DC | PRN
  Administered 2017-12-07: 10 mg via INTRAVENOUS

## 2017-12-07 SURGICAL SUPPLY — 28 items
ADH SKN CLS APL DERMABOND .7 (GAUZE/BANDAGES/DRESSINGS)
BAG DECANTER FOR FLEXI CONT (MISCELLANEOUS) ×2 IMPLANT
BLADE SURG 15 STRL LF DISP TIS (BLADE) ×1 IMPLANT
BLADE SURG 15 STRL SS (BLADE) ×2
CHLORAPREP W/TINT 26ML (MISCELLANEOUS) ×2 IMPLANT
COVER SURGICAL LIGHT HANDLE (MISCELLANEOUS) ×2 IMPLANT
COVER WAND RF STERILE (DRAPES) IMPLANT
DECANTER SPIKE VIAL GLASS SM (MISCELLANEOUS) ×2 IMPLANT
DERMABOND ADVANCED (GAUZE/BANDAGES/DRESSINGS)
DERMABOND ADVANCED .7 DNX12 (GAUZE/BANDAGES/DRESSINGS) IMPLANT
DRAPE C-ARM 42X120 X-RAY (DRAPES) ×2 IMPLANT
DRAPE LAPAROSCOPIC ABDOMINAL (DRAPES) ×2 IMPLANT
ELECT PENCIL ROCKER SW 15FT (MISCELLANEOUS) ×2 IMPLANT
ELECT REM PT RETURN 15FT ADLT (MISCELLANEOUS) ×2 IMPLANT
GAUZE 4X4 16PLY RFD (DISPOSABLE) ×2 IMPLANT
GLOVE EUDERMIC 7 POWDERFREE (GLOVE) ×2 IMPLANT
GOWN STRL REUS W/TWL XL LVL3 (GOWN DISPOSABLE) ×2 IMPLANT
KIT BASIN OR (CUSTOM PROCEDURE TRAY) ×2 IMPLANT
KIT PORT POWER 8FR ISP CVUE (Port) ×1 IMPLANT
NEEDLE HYPO 22GX1.5 SAFETY (NEEDLE) ×2 IMPLANT
PACK BASIC VI WITH GOWN DISP (CUSTOM PROCEDURE TRAY) ×2 IMPLANT
SUT MNCRL AB 4-0 PS2 18 (SUTURE) ×2 IMPLANT
SUT PROLENE 2 0 SH DA (SUTURE) ×2 IMPLANT
SUT VIC AB 3-0 SH 18 (SUTURE) ×2 IMPLANT
SYR 10ML LL (SYRINGE) ×2 IMPLANT
SYR 20CC LL (SYRINGE) ×2 IMPLANT
TOWEL OR 17X26 10 PK STRL BLUE (TOWEL DISPOSABLE) ×2 IMPLANT
TOWEL OR NON WOVEN STRL DISP B (DISPOSABLE) ×2 IMPLANT

## 2017-12-07 NOTE — Anesthesia Procedure Notes (Signed)
Procedure Name: LMA Insertion Date/Time: 12/07/2017 2:17 PM Performed by: Maxwell Caul, CRNA Pre-anesthesia Checklist: Patient identified, Emergency Drugs available, Suction available and Patient being monitored Patient Re-evaluated:Patient Re-evaluated prior to induction Oxygen Delivery Method: Circle system utilized Preoxygenation: Pre-oxygenation with 100% oxygen Induction Type: IV induction LMA: LMA inserted LMA Size: 4.0 Number of attempts: 1 Placement Confirmation: positive ETCO2 and breath sounds checked- equal and bilateral Tube secured with: Tape Dental Injury: Teeth and Oropharynx as per pre-operative assessment

## 2017-12-07 NOTE — Interval H&P Note (Signed)
History and Physical Interval Note:  12/07/2017 1:57 PM  Sheena Joyce  has presented today for surgery, with the diagnosis of cancer right breast  The various methods of treatment have been discussed with the patient and family. After consideration of risks, benefits and other options for treatment, the patient has consented to  Procedure(s): INSERTION PORT-A-CATH WITH ULTRA SOUND (N/A) as a surgical intervention .  The patient's history has been reviewed, patient examined, no change in status, stable for surgery.  I have reviewed the patient's chart and labs.  Questions were answered to the patient's satisfaction.     [name]

## 2017-12-07 NOTE — Anesthesia Preprocedure Evaluation (Addendum)
Anesthesia Evaluation  Patient identified by MRN, date of birth, ID band Patient awake    Reviewed: Allergy & Precautions, NPO status , Patient's Chart, lab work & pertinent test results  History of Anesthesia Complications (+) PONV, Family history of anesthesia reaction and history of anesthetic complications (hx of aspiration pneumonia after surgery)  Airway Mallampati: II  TM Distance: >3 FB Neck ROM: Full    Dental no notable dental hx. (+) Caps,    Pulmonary pneumonia, resolved,    Pulmonary exam normal breath sounds clear to auscultation       Cardiovascular negative cardio ROS Normal cardiovascular exam Rhythm:Regular Rate:Normal     Neuro/Psych  Headaches, PSYCHIATRIC DISORDERS Anxiety Depression    GI/Hepatic Neg liver ROS, GERD  Medicated and Controlled,  Endo/Other  diabetes, Well Controlled, Type 2Right Breast Ca Obesity   Renal/GU negative Renal ROS  negative genitourinary   Musculoskeletal  (+) Arthritis , Osteoarthritis,    Abdominal (+) + obese,   Peds  Hematology negative hematology ROS (+)   Anesthesia Other Findings   Reproductive/Obstetrics  Breast cancer                             Anesthesia Physical Anesthesia Plan  ASA: II  Anesthesia Plan: General   Post-op Pain Management:    Induction: Intravenous  PONV Risk Score and Plan: 4 or greater and Ondansetron, Dexamethasone and Treatment may vary due to age or medical condition  Airway Management Planned: LMA  Additional Equipment:   Intra-op Plan:   Post-operative Plan: Extubation in OR  Informed Consent: I have reviewed the patients History and Physical, chart, labs and discussed the procedure including the risks, benefits and alternatives for the proposed anesthesia with the patient or authorized representative who has indicated his/her understanding and acceptance.   Dental advisory given  Plan  Discussed with: CRNA and Surgeon  Anesthesia Plan Comments:         Anesthesia Quick Evaluation

## 2017-12-07 NOTE — Discharge Instructions (Signed)
    PORT-A-CATH: POST OP INSTRUCTIONS  Always review your discharge instruction sheet given to you by the facility where your surgery was performed.   1. A prescription for pain medication may be given to you upon discharge. Take your pain medication as prescribed, if needed. If narcotic pain medicine is not needed, then you make take acetaminophen (Tylenol) or ibuprofen (Advil) as needed.  2. Take your usually prescribed medications unless otherwise directed. 3. If you need a refill on your pain medication, please contact our office. All narcotic pain medicine now requires a paper prescription.  Phoned in and fax refills are no longer allowed by law.  Prescriptions will not be filled after 5 pm or on weekends.  4. You should follow a light diet for the remainder of the day after your procedure. 5. Most patients will experience some mild swelling and/or bruising in the area of the incision. It may take several days to resolve. 6. It is common to experience some constipation if taking pain medication after surgery. Increasing fluid intake and taking a stool softener (such as Colace) will usually help or prevent this problem from occurring. A mild laxative (Milk of Magnesia or Miralax) should be taken according to package directions if there are no bowel movements after 48 hours.  7. Unless discharge instructions indicate otherwise, you may remove your bandages 48 hours after surgery, and you may shower at that time. You may have steri-strips (small white skin tapes) in place directly over the incision.  These strips should be left on the skin for 7-10 days.  If your surgeon used Dermabond (skin glue) on the incision, you may shower in 24 hours.  The glue will flake off over the next 2-3 weeks.  8. If your port is left accessed at the end of surgery (needle left in port), the dressing cannot get wet and should only by changed by a healthcare professional. When the port is no longer accessed (when the  needle has been removed), follow step 7.   9. ACTIVITIES:  Limit activity involving your arms for the next 72 hours. Do no strenuous exercise or activity for 1 week. You may drive when you are no longer taking prescription pain medication, you can comfortably wear a seatbelt, and you can maneuver your car. 10.You may need to see your doctor in the office for a follow-up appointment.  Please       check with your doctor.  11.When you receive a new Port-a-Cath, you will get a product guide and        ID card.  Please keep them in case you need them.  WHEN TO CALL YOUR DOCTOR ([phone]): 1. Fever over 101.0 2. Chills 3. Continued bleeding from incision 4. Increased redness and tenderness at the site 5. Shortness of breath, difficulty breathing   The clinic staff is available to answer your questions during regular business hours. Please don't hesitate to call and ask to speak to one of the nurses or medical assistants for clinical concerns. If you have a medical emergency, go to the nearest emergency room or call 911.  A surgeon from [facility] is always on call at the hospital.     For further information, please visit [email or web address]      

## 2017-12-07 NOTE — Op Note (Addendum)
Patient Name:           Sheena Joyce   Date of Surgery:        12/07/2017  Pre op Diagnosis:      Cancer right breast  Post op Diagnosis:    Cancer right breast  Procedure:                 Insertion of PowerPort Clear Vue  8 French tunneled venous vascular access device                                      Use of fluoroscopy for guidance and positioning                                         Surgeon:                     Edsel Petrin. Dalbert Batman, M.D., FACS  Assistant:                      OR staff  Operative Indications:   The patient is a 72 year old female presenting for elective insertion of PowerPort.  Dr. Hinton Rao and Dr. Orlene Erm are involved in her care. Her PCP is Dr. [name].      On October 19, 2017 showed underwent right breast lumpectomy with 2 radioactive seeds and sentinel lymph node biopsy. Final pathology shows invasive lobular carcinoma right breast, 4.5 cm. Margins are negative. 2 out of 4 sentinel lymph nodes have micrometastatic deposit. Hormone receptor strongly positive. HER-2 negative.  She has seen Dr. Hinton Rao in [place].  She recommends Port-A-Cath insertion to facilitate chemotherapy.   I discussed the indications, techniques, and risk of PowerPort insertion with her.  She agrees with this plan.   Operative Findings:       The port was inserted through the left subclavian vein.  At the completion of the case the catheter flushed easily and had excellent blood return.  The catheter tip was in the superior vena cava at the right atrial junction.  Procedure in Detail:          Following the induction of general anesthesia with LMA device of the patient was positioned with a roll behind her shoulders and her arms tucked at her sides.  Her neck and chest were prepped and draped in a sterile fashion.  Intravenous antibiotics were given.  Surgical timeout was performed.  0.5% Marcaine with epinephrine was used as a local infiltration anesthetic.  A left  subclavian venipuncture was performed.  Good blood return on a single pass and the wire was inserted without difficulty.  Fluoroscopy confirmed the wire in the superior vena cava.  A small incision was made at the wire insertion site.  A transverse incision was made below the midpoint of the left clavicle.  And a subcutaneous pocket created.  The catheter was passed from the port pocket site to the wire insertion site.  Using the C arm I drew a template on the chest wall to measure in position of the catheter properly.  The catheter was cut  24 cm in length.  The catheter was secured to the port with the locking device.  The port was sutured to the pectoralis fascia with 3 interrupted sutures  of 2-0 Prolene.  The dilator and peel-away sheath assembly were inserted over the guidewire into the central venous circulation.  After removing the dilator and the wire the catheter was threaded easily and the peel-away sheath removed.  I had excellent blood return and the catheter flushed easily.  Fluoroscopy confirmed the catheter tip to be in the superior vena cava at the right atrial junction.  There was no deformity of the catheter anywhere along its course.  The port and catheter were flushed with concentrated heparin.  There was no bleeding.  Subcutaneous tissues were closed with 3-0 Vicryl sutures and the skin closed with a running subcuticular 4-0 Monocryl and Dermabond.  Patient tolerated the procedure well was taken to PACU in stable condition.  EBL 10 cc.  Counts correct.  Complications none.    Edsel Petrin. Dalbert Batman, M.D., FACS General and Minimally Invasive Surgery Breast and Colorectal Surgery  12/07/2017 3:07 PM

## 2017-12-07 NOTE — Transfer of Care (Signed)
Immediate Anesthesia Transfer of Care Note  Patient: Sheena Joyce  Procedure(s) Performed: INSERTION PORT-A-CATH WITH ULTRA SOUND (Left )  Patient Location: PACU  Anesthesia Type:General  Level of Consciousness: awake, alert , oriented and patient cooperative  Airway & Oxygen Therapy: Patient Spontanous Breathing and Patient connected to face mask oxygen  Post-op Assessment: Report given to RN, Post -op Vital signs reviewed and stable and Patient moving all extremities  Post vital signs: Reviewed and stable  Last Vitals:  Vitals Value Taken Time  BP 143/79 12/07/2017  3:15 PM  Temp    Pulse 78 12/07/2017  3:16 PM  Resp 17 12/07/2017  3:16 PM  SpO2 100 % 12/07/2017  3:16 PM  Vitals shown include unvalidated device data.  Last Pain:  Vitals:   12/07/17 1122  TempSrc:   PainSc: 0-No pain      Patients Stated Pain Goal: 4 (15/61/53 7943)  Complications: No apparent anesthesia complications

## 2017-12-07 NOTE — Anesthesia Postprocedure Evaluation (Signed)
Anesthesia Post Note  Patient: Sheena Joyce  Procedure(s) Performed: INSERTION PORT-A-CATH WITH ULTRA SOUND (Left )     Patient location during evaluation: PACU Anesthesia Type: General Level of consciousness: awake and alert and oriented Pain management: pain level controlled Vital Signs Assessment: post-procedure vital signs reviewed and stable Respiratory status: spontaneous breathing, nonlabored ventilation and respiratory function stable Cardiovascular status: blood pressure returned to baseline and stable Postop Assessment: no apparent nausea or vomiting Anesthetic complications: no    Last Vitals:  Vitals:   12/07/17 1600 12/07/17 1658  BP: 131/80 (!) 143/84  Pulse: 70   Resp: 10   Temp:    SpO2: 93%     Last Pain:  Vitals:   12/07/17 1515  TempSrc:   PainSc: 0-No pain                 Margrete Delude A.

## 2017-12-08 ENCOUNTER — Encounter (HOSPITAL_COMMUNITY): Payer: Self-pay | Admitting: General Surgery

## 2017-12-12 DIAGNOSIS — M7989 Other specified soft tissue disorders: Secondary | ICD-10-CM | POA: Diagnosis not present

## 2017-12-12 DIAGNOSIS — Z23 Encounter for immunization: Secondary | ICD-10-CM | POA: Diagnosis not present

## 2017-12-12 DIAGNOSIS — M810 Age-related osteoporosis without current pathological fracture: Secondary | ICD-10-CM | POA: Diagnosis not present

## 2017-12-12 DIAGNOSIS — C50811 Malignant neoplasm of overlapping sites of right female breast: Secondary | ICD-10-CM | POA: Diagnosis not present

## 2017-12-13 ENCOUNTER — Encounter: Payer: Self-pay | Admitting: Oncology

## 2017-12-13 DIAGNOSIS — C50919 Malignant neoplasm of unspecified site of unspecified female breast: Secondary | ICD-10-CM | POA: Diagnosis not present

## 2017-12-13 DIAGNOSIS — T827XXA Infection and inflammatory reaction due to other cardiac and vascular devices, implants and grafts, initial encounter: Secondary | ICD-10-CM | POA: Diagnosis not present

## 2017-12-13 DIAGNOSIS — T82848A Pain from vascular prosthetic devices, implants and grafts, initial encounter: Secondary | ICD-10-CM | POA: Diagnosis not present

## 2017-12-14 DIAGNOSIS — C50811 Malignant neoplasm of overlapping sites of right female breast: Secondary | ICD-10-CM | POA: Diagnosis not present

## 2017-12-14 DIAGNOSIS — Z5111 Encounter for antineoplastic chemotherapy: Secondary | ICD-10-CM | POA: Diagnosis not present

## 2017-12-18 DIAGNOSIS — K219 Gastro-esophageal reflux disease without esophagitis: Secondary | ICD-10-CM | POA: Diagnosis not present

## 2017-12-18 DIAGNOSIS — F418 Other specified anxiety disorders: Secondary | ICD-10-CM | POA: Diagnosis not present

## 2017-12-18 DIAGNOSIS — C50911 Malignant neoplasm of unspecified site of right female breast: Secondary | ICD-10-CM | POA: Diagnosis not present

## 2017-12-18 DIAGNOSIS — K5900 Constipation, unspecified: Secondary | ICD-10-CM | POA: Diagnosis not present

## 2017-12-18 DIAGNOSIS — F329 Major depressive disorder, single episode, unspecified: Secondary | ICD-10-CM | POA: Diagnosis not present

## 2017-12-18 DIAGNOSIS — I2699 Other pulmonary embolism without acute cor pulmonale: Secondary | ICD-10-CM | POA: Diagnosis not present

## 2017-12-18 DIAGNOSIS — I714 Abdominal aortic aneurysm, without rupture: Secondary | ICD-10-CM | POA: Diagnosis not present

## 2017-12-18 DIAGNOSIS — F419 Anxiety disorder, unspecified: Secondary | ICD-10-CM

## 2017-12-18 DIAGNOSIS — Z9889 Other specified postprocedural states: Secondary | ICD-10-CM | POA: Diagnosis not present

## 2017-12-18 DIAGNOSIS — Z79899 Other long term (current) drug therapy: Secondary | ICD-10-CM | POA: Diagnosis not present

## 2017-12-18 DIAGNOSIS — R0902 Hypoxemia: Secondary | ICD-10-CM | POA: Diagnosis not present

## 2017-12-18 DIAGNOSIS — K5730 Diverticulosis of large intestine without perforation or abscess without bleeding: Secondary | ICD-10-CM | POA: Diagnosis not present

## 2017-12-18 DIAGNOSIS — R52 Pain, unspecified: Secondary | ICD-10-CM | POA: Diagnosis not present

## 2017-12-18 DIAGNOSIS — G43909 Migraine, unspecified, not intractable, without status migrainosus: Secondary | ICD-10-CM | POA: Diagnosis not present

## 2017-12-18 DIAGNOSIS — Z8619 Personal history of other infectious and parasitic diseases: Secondary | ICD-10-CM | POA: Diagnosis not present

## 2017-12-18 DIAGNOSIS — I2693 Single subsegmental pulmonary embolism without acute cor pulmonale: Secondary | ICD-10-CM | POA: Diagnosis not present

## 2017-12-18 DIAGNOSIS — R51 Headache: Secondary | ICD-10-CM | POA: Diagnosis not present

## 2017-12-19 DIAGNOSIS — F419 Anxiety disorder, unspecified: Secondary | ICD-10-CM | POA: Diagnosis not present

## 2017-12-19 DIAGNOSIS — I714 Abdominal aortic aneurysm, without rupture: Secondary | ICD-10-CM | POA: Diagnosis not present

## 2017-12-19 DIAGNOSIS — C50911 Malignant neoplasm of unspecified site of right female breast: Secondary | ICD-10-CM | POA: Diagnosis not present

## 2017-12-19 DIAGNOSIS — R52 Pain, unspecified: Secondary | ICD-10-CM | POA: Diagnosis not present

## 2017-12-19 DIAGNOSIS — R0902 Hypoxemia: Secondary | ICD-10-CM | POA: Diagnosis not present

## 2017-12-19 DIAGNOSIS — I2693 Single subsegmental pulmonary embolism without acute cor pulmonale: Secondary | ICD-10-CM | POA: Diagnosis not present

## 2017-12-19 DIAGNOSIS — K5900 Constipation, unspecified: Secondary | ICD-10-CM | POA: Diagnosis not present

## 2017-12-19 DIAGNOSIS — G43909 Migraine, unspecified, not intractable, without status migrainosus: Secondary | ICD-10-CM | POA: Diagnosis not present

## 2017-12-20 DIAGNOSIS — Z9049 Acquired absence of other specified parts of digestive tract: Secondary | ICD-10-CM | POA: Diagnosis not present

## 2017-12-20 DIAGNOSIS — E785 Hyperlipidemia, unspecified: Secondary | ICD-10-CM | POA: Diagnosis not present

## 2017-12-20 DIAGNOSIS — F419 Anxiety disorder, unspecified: Secondary | ICD-10-CM | POA: Diagnosis not present

## 2017-12-20 DIAGNOSIS — C50911 Malignant neoplasm of unspecified site of right female breast: Secondary | ICD-10-CM | POA: Diagnosis not present

## 2017-12-20 DIAGNOSIS — M1990 Unspecified osteoarthritis, unspecified site: Secondary | ICD-10-CM | POA: Diagnosis not present

## 2017-12-20 DIAGNOSIS — I2699 Other pulmonary embolism without acute cor pulmonale: Secondary | ICD-10-CM | POA: Diagnosis not present

## 2017-12-20 DIAGNOSIS — K5730 Diverticulosis of large intestine without perforation or abscess without bleeding: Secondary | ICD-10-CM | POA: Diagnosis not present

## 2017-12-20 DIAGNOSIS — Z9221 Personal history of antineoplastic chemotherapy: Secondary | ICD-10-CM | POA: Diagnosis not present

## 2017-12-20 DIAGNOSIS — K219 Gastro-esophageal reflux disease without esophagitis: Secondary | ICD-10-CM | POA: Diagnosis not present

## 2017-12-20 DIAGNOSIS — F339 Major depressive disorder, recurrent, unspecified: Secondary | ICD-10-CM | POA: Diagnosis not present

## 2017-12-21 DIAGNOSIS — M810 Age-related osteoporosis without current pathological fracture: Secondary | ICD-10-CM | POA: Diagnosis not present

## 2017-12-21 DIAGNOSIS — C50811 Malignant neoplasm of overlapping sites of right female breast: Secondary | ICD-10-CM | POA: Diagnosis not present

## 2017-12-21 DIAGNOSIS — Z5111 Encounter for antineoplastic chemotherapy: Secondary | ICD-10-CM | POA: Diagnosis not present

## 2017-12-22 ENCOUNTER — Other Ambulatory Visit: Payer: Self-pay

## 2017-12-22 DIAGNOSIS — M810 Age-related osteoporosis without current pathological fracture: Secondary | ICD-10-CM | POA: Diagnosis not present

## 2017-12-22 DIAGNOSIS — R300 Dysuria: Secondary | ICD-10-CM | POA: Diagnosis not present

## 2017-12-22 DIAGNOSIS — R197 Diarrhea, unspecified: Secondary | ICD-10-CM | POA: Diagnosis not present

## 2017-12-22 DIAGNOSIS — C50811 Malignant neoplasm of overlapping sites of right female breast: Secondary | ICD-10-CM | POA: Diagnosis not present

## 2017-12-22 DIAGNOSIS — I2699 Other pulmonary embolism without acute cor pulmonale: Secondary | ICD-10-CM | POA: Diagnosis not present

## 2017-12-22 DIAGNOSIS — Z7901 Long term (current) use of anticoagulants: Secondary | ICD-10-CM | POA: Diagnosis not present

## 2017-12-22 NOTE — Patient Outreach (Signed)
[facility]) Care Management  12/22/2017  LAURELAI LEPP 1945-07-01 [id number]     Transition of Care Referral  Referral Date: 12/22/17 Referral Source: [facility] Discharge Report Date of Admission: unknown Diagnosis: unknown Date of Discharge: 12/19/17 Facility: [place]: [facility]   Referral received. No outreach warranted at this time. TOC will be completed by primary care provider office(per [facility]- Dr. [name] with [facility]) who will refer to [facility] care mgmt if needed.     Plan: RN CM will close case at this time.   Enzo Montgomery, RN,BSN,CCM [facility] Telephonic Care Management Coordinator Direct Phone: [phone] Toll Free: [phone] Fax: [phone]

## 2017-12-23 DIAGNOSIS — M810 Age-related osteoporosis without current pathological fracture: Secondary | ICD-10-CM | POA: Diagnosis not present

## 2017-12-23 DIAGNOSIS — C50811 Malignant neoplasm of overlapping sites of right female breast: Secondary | ICD-10-CM | POA: Diagnosis not present

## 2017-12-27 DIAGNOSIS — I2699 Other pulmonary embolism without acute cor pulmonale: Secondary | ICD-10-CM | POA: Diagnosis not present

## 2017-12-28 DIAGNOSIS — M810 Age-related osteoporosis without current pathological fracture: Secondary | ICD-10-CM | POA: Diagnosis not present

## 2017-12-28 DIAGNOSIS — E860 Dehydration: Secondary | ICD-10-CM | POA: Diagnosis not present

## 2017-12-28 DIAGNOSIS — C50111 Malignant neoplasm of central portion of right female breast: Secondary | ICD-10-CM | POA: Diagnosis not present

## 2017-12-28 DIAGNOSIS — C50811 Malignant neoplasm of overlapping sites of right female breast: Secondary | ICD-10-CM | POA: Diagnosis not present

## 2017-12-28 DIAGNOSIS — Z170 Estrogen receptor positive status [ER+]: Secondary | ICD-10-CM | POA: Diagnosis not present

## 2018-01-03 DIAGNOSIS — I2699 Other pulmonary embolism without acute cor pulmonale: Secondary | ICD-10-CM | POA: Diagnosis not present

## 2018-01-04 DIAGNOSIS — C50811 Malignant neoplasm of overlapping sites of right female breast: Secondary | ICD-10-CM | POA: Diagnosis not present

## 2018-01-04 DIAGNOSIS — M810 Age-related osteoporosis without current pathological fracture: Secondary | ICD-10-CM | POA: Diagnosis not present

## 2018-01-11 DIAGNOSIS — Z7952 Long term (current) use of systemic steroids: Secondary | ICD-10-CM | POA: Diagnosis not present

## 2018-01-11 DIAGNOSIS — Z170 Estrogen receptor positive status [ER+]: Secondary | ICD-10-CM | POA: Diagnosis not present

## 2018-01-11 DIAGNOSIS — G43909 Migraine, unspecified, not intractable, without status migrainosus: Secondary | ICD-10-CM | POA: Diagnosis not present

## 2018-01-11 DIAGNOSIS — C50111 Malignant neoplasm of central portion of right female breast: Secondary | ICD-10-CM | POA: Diagnosis not present

## 2018-01-11 DIAGNOSIS — C50811 Malignant neoplasm of overlapping sites of right female breast: Secondary | ICD-10-CM | POA: Diagnosis not present

## 2018-01-11 DIAGNOSIS — M810 Age-related osteoporosis without current pathological fracture: Secondary | ICD-10-CM | POA: Diagnosis not present

## 2018-01-12 DIAGNOSIS — R7301 Impaired fasting glucose: Secondary | ICD-10-CM | POA: Diagnosis not present

## 2018-01-12 DIAGNOSIS — C50911 Malignant neoplasm of unspecified site of right female breast: Secondary | ICD-10-CM | POA: Diagnosis not present

## 2018-01-12 DIAGNOSIS — I2699 Other pulmonary embolism without acute cor pulmonale: Secondary | ICD-10-CM | POA: Diagnosis not present

## 2018-01-12 DIAGNOSIS — Z6833 Body mass index (BMI) 33.0-33.9, adult: Secondary | ICD-10-CM | POA: Diagnosis not present

## 2018-01-17 DIAGNOSIS — E860 Dehydration: Secondary | ICD-10-CM

## 2018-01-17 DIAGNOSIS — K1231 Oral mucositis (ulcerative) due to antineoplastic therapy: Secondary | ICD-10-CM | POA: Diagnosis not present

## 2018-01-17 DIAGNOSIS — Z7952 Long term (current) use of systemic steroids: Secondary | ICD-10-CM | POA: Diagnosis not present

## 2018-01-17 DIAGNOSIS — C50811 Malignant neoplasm of overlapping sites of right female breast: Secondary | ICD-10-CM | POA: Diagnosis not present

## 2018-01-17 DIAGNOSIS — Z7901 Long term (current) use of anticoagulants: Secondary | ICD-10-CM

## 2018-01-17 DIAGNOSIS — I2699 Other pulmonary embolism without acute cor pulmonale: Secondary | ICD-10-CM

## 2018-01-17 DIAGNOSIS — G43909 Migraine, unspecified, not intractable, without status migrainosus: Secondary | ICD-10-CM | POA: Diagnosis not present

## 2018-01-17 DIAGNOSIS — Z170 Estrogen receptor positive status [ER+]: Secondary | ICD-10-CM | POA: Diagnosis not present

## 2018-01-17 DIAGNOSIS — C50111 Malignant neoplasm of central portion of right female breast: Secondary | ICD-10-CM | POA: Diagnosis not present

## 2018-02-01 DIAGNOSIS — Z0001 Encounter for general adult medical examination with abnormal findings: Secondary | ICD-10-CM | POA: Diagnosis not present

## 2018-02-01 DIAGNOSIS — C50811 Malignant neoplasm of overlapping sites of right female breast: Secondary | ICD-10-CM | POA: Diagnosis not present

## 2018-02-01 DIAGNOSIS — M810 Age-related osteoporosis without current pathological fracture: Secondary | ICD-10-CM | POA: Diagnosis not present

## 2018-02-03 DIAGNOSIS — H61303 Acquired stenosis of external ear canal, unspecified, bilateral: Secondary | ICD-10-CM | POA: Diagnosis not present

## 2018-02-03 DIAGNOSIS — H6122 Impacted cerumen, left ear: Secondary | ICD-10-CM | POA: Diagnosis not present

## 2018-02-03 DIAGNOSIS — Z853 Personal history of malignant neoplasm of breast: Secondary | ICD-10-CM | POA: Diagnosis not present

## 2018-02-03 DIAGNOSIS — H9190 Unspecified hearing loss, unspecified ear: Secondary | ICD-10-CM | POA: Diagnosis not present

## 2018-02-03 DIAGNOSIS — Z79899 Other long term (current) drug therapy: Secondary | ICD-10-CM | POA: Diagnosis not present

## 2018-02-06 ENCOUNTER — Encounter: Payer: Self-pay | Admitting: Cardiology

## 2018-02-06 ENCOUNTER — Ambulatory Visit (INDEPENDENT_AMBULATORY_CARE_PROVIDER_SITE_OTHER): Payer: PPO | Admitting: Cardiology

## 2018-02-06 DIAGNOSIS — R0789 Other chest pain: Secondary | ICD-10-CM | POA: Diagnosis not present

## 2018-02-06 MED ORDER — NITROGLYCERIN 0.4 MG SL SUBL: 0.4000 mg | SUBLINGUAL_TABLET | SUBLINGUAL | 11 refills | Status: DC | PRN

## 2018-02-06 NOTE — Progress Notes (Signed)
Cardiology Office Note:    Date:  02/06/2018   ID:  Sheena Joyce, DOB 1945-06-29, MRN [id number]  PCP:  Street, [name], MD  Cardiologist:  Jenean Lindau, MD   Referring MD: [address] [name], *    ASSESSMENT:    1. Chest pressure    PLAN:    In order of problems listed above:  1. Primary prevention stressed with the patient.  Importance of compliance with diet and medication stressed and she vocalized understanding.  Blood pressure is stable.  Patient symptoms do need an evaluation and we will do a Lexiscan sestamibi to understand her symptoms.  Echocardiogram will be done to assess murmur heard on auscultation. 2. Sublingual nitroglycerin prescription was sent, its protocol and 911 protocol explained and the patient vocalized understanding questions were answered to the patient's satisfaction 3. I discussed my findings with her at extensive length.  She knows to go to the nearest emergency room for any significant concerns. 4. Patient will be seen in follow-up appointment in 6 months or earlier if the patient has any concerns    Medication Adjustments/Labs and Tests Ordered: Current medicines are reviewed at length with the patient today.  Concerns regarding medicines are outlined above.  Orders Placed This Encounter  Procedures  . MYOCARDIAL PERFUSION IMAGING  . ECHOCARDIOGRAM COMPLETE   Meds ordered this encounter  Medications  . nitroGLYCERIN (NITROSTAT) 0.4 MG SL tablet    Sig: Place 1 tablet (0.4 mg total) under the tongue every 5 (five) minutes as needed.    Dispense:  25 tablet    Refill:  11     History of Present Illness:    Sheena Joyce is a 72 y.o. female who is being seen today for the evaluation of chest pressure at the request of Street, [name], *.  Patient has history of breast cancer and has undergone chemotherapy.  She is here for evaluation as she complains of chest tightness and pressure-like sensation at times  not related to exertion.  No chest pain orthopnea or PND.  This does not happen with exertion.  She tells me it comes on and off.  She might feel pressure in the neck at times with the symptoms.  At the time of my evaluation, the patient is alert awake oriented and in no distress.  Past Medical History:  Diagnosis Date  . Anxiety   . Arthritis   . Cancer of overlapping sites of right female breast ([place]) 10/19/2017  . Complication of anesthesia    "became unresposive after surgery"- 2017 , aspirated after surgery and had pneumonia- 2017   . Depression   . Diabetes mellitus without complication ([place])    "on the borderline"  . Diverticulitis   . Family history of adverse reaction to anesthesia    mother slow to wake up afgter 5 days, sister had port placed and had issues - went into coma but was very sick   . GERD (gastroesophageal reflux disease)   . Headache   . Pneumonia   . PONV (postoperative nausea and vomiting)   . Prolapsed uterus    and bladder    Past Surgical History:  Procedure Laterality Date  . BREAST LUMPECTOMY WITH RADIOACTIVE SEED AND SENTINEL LYMPH NODE BIOPSY Right 10/19/2017   Procedure: INJECT BLUE DYE RIGHT BREAST AND RIGHT BREAST LUMPECTOMY WITH TWO RADIOACTIVE SEEDS AND RIGHT AXILLARY SENTINEL LYMPH NODE BIOPSY;  Surgeon: [name], MD;  Location: [facility];  Service: General;  Laterality: Right;  . COLON SURGERY    . HERNIA REPAIR    . PORTACATH PLACEMENT Left 12/07/2017   Procedure: INSERTION PORT-A-CATH WITH ULTRA SOUND;  Surgeon: [name], MD;  Location: [facility];  Service: General;  Laterality: Left;    Current Medications: Current Meds  Medication Sig  . clotrimazole-betamethasone (LOTRISONE) cream Apply 1 application topically 2 (two) times daily as needed (irritation).   Marland Kitchen dexamethasone (DECADRON) 4 MG tablet TAKE 2 TABLETS BY MOUTH IN THE MORNING WITH FOOD THE DAY BEFORE AND THE DAY AFTER chemotherapy WITH EACH CYCLE.  Marland Kitchen enoxaparin (LOVENOX) 120  MG/0.8ML injection INJECT THE contents OF 1 syringe SUBCUTANEOUSLY DAILY  . LORazepam (ATIVAN) 1 MG tablet Take 1 tablet by mouth at bedtime.  . ondansetron (ZOFRAN) 4 MG tablet Take 1 tablet by mouth every 4 (four) hours as needed.  Marland Kitchen RANITIDINE HCL PO Take by mouth.  . rizatriptan (MAXALT) 10 MG tablet Take 5-10 mg by mouth 2 (two) times daily as needed for migraine. May repeat in 2 hours if needed  . simvastatin (ZOCOR) 40 MG tablet Take 40 mg by mouth at bedtime.   Marland Kitchen venlafaxine XR (EFFEXOR-XR) 75 MG 24 hr capsule Take 75 mg by mouth daily with breakfast.     Allergies:   Codeine and Hydrocodone   Social History   Socioeconomic History  . Marital status: Married    Spouse name: Not on file  . Number of children: Not on file  . Years of education: Not on file  . Highest education level: Not on file  Occupational History  . Not on file  Social Needs  . Financial resource strain: Not on file  . Food insecurity:    Worry: Not on file    Inability: Not on file  . Transportation needs:    Medical: Not on file    Non-medical: Not on file  Tobacco Use  . Smoking status: Never Smoker  . Smokeless tobacco: Never Used  Substance and Sexual Activity  . Alcohol use: Never    Frequency: Never  . Drug use: Never  . Sexual activity: Not on file  Lifestyle  . Physical activity:    Days per week: Not on file    Minutes per session: Not on file  . Stress: Not on file  Relationships  . Social connections:    Talks on phone: Not on file    Gets together: Not on file    Attends religious service: Not on file    Active member of club or organization: Not on file    Attends meetings of clubs or organizations: Not on file    Relationship status: Not on file  Other Topics Concern  . Not on file  Social History Narrative  . Not on file     Family History: The patient's family history includes Heart disease in her father; Osteoporosis in her mother.  ROS:   Please see the history  of present illness.    All other systems reviewed and are negative.  EKGs/Labs/Other Studies Reviewed:    The following studies were reviewed today: EKG reveals sinus rhythm with nonspecific ST-T changes   Recent Labs: 10/17/2017: ALT 17 12/07/2017: BUN 13; Creatinine, Ser 0.89; Hemoglobin 12.8; Platelets 296; Potassium 3.8; Sodium 138  Recent Lipid Panel No results found for: CHOL, TRIG, HDL, CHOLHDL, VLDL, LDLCALC, LDLDIRECT  Physical Exam:    VS:  BP 92/68   Pulse 64   Ht 5' (1.524 m)   Wt  171 lb 9.6 oz (77.8 kg)   SpO2 100%   BMI 33.51 kg/m     Wt Readings from Last 3 Encounters:  02/06/18 171 lb 9.6 oz (77.8 kg)  12/07/17 170 lb 6.4 oz (77.3 kg)  10/17/17 173 lb 11.2 oz (78.8 kg)     GEN: Patient is in no acute distress HEENT: Normal NECK: No JVD; No carotid bruits LYMPHATICS: No lymphadenopathy CARDIAC: S1 S2 regular, 2/6 systolic murmur at the apex. RESPIRATORY:  Clear to auscultation without rales, wheezing or rhonchi  ABDOMEN: Soft, non-tender, non-distended MUSCULOSKELETAL:  No edema; No deformity  SKIN: Warm and dry NEUROLOGIC:  Alert and oriented x 3 PSYCHIATRIC:  Normal affect    Signed, Jenean Lindau, MD  02/06/2018 2:53 PM    [place]

## 2018-02-06 NOTE — Patient Instructions (Signed)
Medication Instructions:  Your physician has recommended you make the following change in your medication:   START Nitroglycerin 0.4 mg sublingual (under your tongue) as needed for chest pain. If experiencing chest pain, stop what you are doing and sit down. Take 1 nitroglycerin and wait 5 minutes. If chest pain continues, take another nitroglycerin and wait 5 minutes. If chest pain does not subside, take 1 more nitroglycerin and dial 911. You make take a total of 3 nitroglycerin in a 15 minute time frame.  If you need a refill on your cardiac medications before your next appointment, please call your pharmacy.   Lab work: None  If you have labs (blood work) drawn today and your tests are completely normal, you will receive your results only by: Marland Kitchen MyChart Message (if you have MyChart) OR . A paper copy in the mail If you have any lab test that is abnormal or we need to change your treatment, we will call you to review the results.  Testing/Procedures: Your physician has requested that you have an echocardiogram. Echocardiography is a painless test that uses sound waves to create images of your heart. It provides your doctor with information about the size and shape of your heart and how well your heart's chambers and valves are working. This procedure takes approximately one hour. There are no restrictions for this procedure.  Your physician has requested that you have a lexiscan myoview. For further information please visit [email or web address]. Please follow instruction sheet, as given.  Follow-Up: At [facility], you and your health needs are our priority.  As part of our continuing mission to provide you with exceptional heart care, we have created designated Provider Care Teams.  These Care Teams include your primary Cardiologist (physician) and Advanced Practice Providers (APPs -  Physician Assistants and Nurse Practitioners) who all work together to provide you with the care you need,  when you need it.  You will need a follow up appointment in 6 months.  Please call our office 2 months in advance to schedule this appointment.  You may see another member of our [employer] Provider Team in [place]: Jenne Campus, MD . Shirlee More, MD  Any Other Special Instructions Will Be Listed Below (If Applicable).  Nitroglycerin sublingual tablets What is this medicine? NITROGLYCERIN (nye troe GLI ser in) is a type of vasodilator. It relaxes blood vessels, increasing the blood and oxygen supply to your heart. This medicine is used to relieve chest pain caused by angina. It is also used to prevent chest pain before activities like climbing stairs, going outdoors in cold weather, or sexual activity. This medicine may be used for other purposes; ask your health care provider or pharmacist if you have questions. COMMON BRAND NAME(S): Nitroquick, Nitrostat, Nitrotab What should I tell my health care provider before I take this medicine? They need to know if you have any of these conditions: -anemia -head injury, recent stroke, or bleeding in the brain -liver disease -previous heart attack -an unusual or allergic reaction to nitroglycerin, other medicines, foods, dyes, or preservatives -pregnant or trying to get pregnant -breast-feeding How should I use this medicine? Take this medicine by mouth as needed. At the first sign of an angina attack (chest pain or tightness) place one tablet under your tongue. You can also take this medicine 5 to 10 minutes before an event likely to produce chest pain. Follow the directions on the prescription label. Let the tablet dissolve under the tongue. Do not swallow whole.  Replace the dose if you accidentally swallow it. It will help if your mouth is not dry. Saliva around the tablet will help it to dissolve more quickly. Do not eat or drink, smoke or chew tobacco while a tablet is dissolving. If you are not better within 5 minutes after taking ONE  dose of nitroglycerin, call 9-1-1 immediately to seek emergency medical care. Do not take more than 3 nitroglycerin tablets over 15 minutes. If you take this medicine often to relieve symptoms of angina, your doctor or health care professional may provide you with different instructions to manage your symptoms. If symptoms do not go away after following these instructions, it is important to call 9-1-1 immediately. Do not take more than 3 nitroglycerin tablets over 15 minutes. Talk to your pediatrician regarding the use of this medicine in children. Special care may be needed. Overdosage: If you think you have taken too much of this medicine contact a poison control center or emergency room at once. NOTE: This medicine is only for you. Do not share this medicine with others. What if I miss a dose? This does not apply. This medicine is only used as needed. What may interact with this medicine? Do not take this medicine with any of the following medications: -certain migraine medicines like ergotamine and dihydroergotamine (DHE) -medicines used to treat erectile dysfunction like sildenafil, tadalafil, and vardenafil -riociguat This medicine may also interact with the following medications: -alteplase -aspirin -heparin -medicines for high blood pressure -medicines for mental depression -other medicines used to treat angina -phenothiazines like chlorpromazine, mesoridazine, prochlorperazine, thioridazine This list may not describe all possible interactions. Give your health care provider a list of all the medicines, herbs, non-prescription drugs, or dietary supplements you use. Also tell them if you smoke, drink alcohol, or use illegal drugs. Some items may interact with your medicine. What should I watch for while using this medicine? Tell your doctor or health care professional if you feel your medicine is no longer working. Keep this medicine with you at all times. Sit or lie down when you take  your medicine to prevent falling if you feel dizzy or faint after using it. Try to remain calm. This will help you to feel better faster. If you feel dizzy, take several deep breaths and lie down with your feet propped up, or bend forward with your head resting between your knees. You may get drowsy or dizzy. Do not drive, use machinery, or do anything that needs mental alertness until you know how this drug affects you. Do not stand or sit up quickly, especially if you are an older patient. This reduces the risk of dizzy or fainting spells. Alcohol can make you more drowsy and dizzy. Avoid alcoholic drinks. Do not treat yourself for coughs, colds, or pain while you are taking this medicine without asking your doctor or health care professional for advice. Some ingredients may increase your blood pressure. What side effects may I notice from receiving this medicine? Side effects that you should report to your doctor or health care professional as soon as possible: -blurred vision -dry mouth -skin rash -sweating -the feeling of extreme pressure in the head -unusually weak or tired Side effects that usually do not require medical attention (report to your doctor or health care professional if they continue or are bothersome): -flushing of the face or neck -headache -irregular heartbeat, palpitations -nausea, vomiting This list may not describe all possible side effects. Call your doctor for medical advice about side  effects. You may report side effects to FDA at 1-800-FDA-1088. Where should I keep my medicine? Keep out of the reach of children. Store at room temperature between 20 and 25 degrees C (68 and 77 degrees F). Store in [facility]. Protect from light and moisture. Keep tightly closed. Throw away any unused medicine after the expiration date. NOTE: This sheet is a summary. It may not cover all possible information. If you have questions about this medicine, talk to your doctor,  pharmacist, or health care provider.  2018 Elsevier/Gold Standard (2012-12-14 17:57:36)

## 2018-02-07 ENCOUNTER — Ambulatory Visit (INDEPENDENT_AMBULATORY_CARE_PROVIDER_SITE_OTHER): Payer: PPO

## 2018-02-07 VITALS — Ht 60.0 in | Wt 171.0 lb

## 2018-02-07 DIAGNOSIS — R0789 Other chest pain: Secondary | ICD-10-CM | POA: Diagnosis not present

## 2018-02-07 MED ORDER — TECHNETIUM TC 99M TETROFOSMIN IV KIT
31.6000 | PACK | Freq: Once | INTRAVENOUS | Status: AC | PRN
  Administered 2018-02-07: 31.6 via INTRAVENOUS

## 2018-02-07 MED ORDER — REGADENOSON 0.4 MG/5ML IV SOLN
0.4000 mg | Freq: Once | INTRAVENOUS | Status: AC
  Administered 2018-02-07: 0.4 mg via INTRAVENOUS

## 2018-02-07 MED ORDER — TECHNETIUM TC 99M TETROFOSMIN IV KIT
10.1000 | PACK | Freq: Once | INTRAVENOUS | Status: AC | PRN
  Administered 2018-02-07: 10.1 via INTRAVENOUS

## 2018-02-08 ENCOUNTER — Ambulatory Visit (INDEPENDENT_AMBULATORY_CARE_PROVIDER_SITE_OTHER): Payer: PPO

## 2018-02-08 DIAGNOSIS — R0789 Other chest pain: Secondary | ICD-10-CM

## 2018-02-08 LAB — MYOCARDIAL PERFUSION IMAGING
CHL CUP RESTING HR STRESS: 81 {beats}/min
LV sys vol: 18 mL
LVDIAVOL: 52 mL (ref 46–106)
NUC STRESS TID: 0.95
Peak HR: 110 {beats}/min
SDS: 4
SRS: 2
SSS: 6

## 2018-02-08 NOTE — Progress Notes (Signed)
Complete echocardiogram has been performed.  Jimmy Kalysta Kneisley RDCS, RVT 

## 2018-02-10 ENCOUNTER — Ambulatory Visit: Payer: PPO | Admitting: Cardiology

## 2018-02-23 DIAGNOSIS — C50111 Malignant neoplasm of central portion of right female breast: Secondary | ICD-10-CM | POA: Diagnosis not present

## 2018-02-23 DIAGNOSIS — I2693 Single subsegmental pulmonary embolism without acute cor pulmonale: Secondary | ICD-10-CM | POA: Diagnosis not present

## 2018-02-23 DIAGNOSIS — C50811 Malignant neoplasm of overlapping sites of right female breast: Secondary | ICD-10-CM | POA: Diagnosis not present

## 2018-02-23 DIAGNOSIS — M810 Age-related osteoporosis without current pathological fracture: Secondary | ICD-10-CM | POA: Diagnosis not present

## 2018-02-23 DIAGNOSIS — Z7901 Long term (current) use of anticoagulants: Secondary | ICD-10-CM | POA: Diagnosis not present

## 2018-03-09 DIAGNOSIS — R42 Dizziness and giddiness: Secondary | ICD-10-CM | POA: Diagnosis not present

## 2018-03-09 DIAGNOSIS — R05 Cough: Secondary | ICD-10-CM | POA: Diagnosis not present

## 2018-03-09 DIAGNOSIS — C50811 Malignant neoplasm of overlapping sites of right female breast: Secondary | ICD-10-CM | POA: Diagnosis not present

## 2018-03-09 DIAGNOSIS — R9431 Abnormal electrocardiogram [ECG] [EKG]: Secondary | ICD-10-CM | POA: Diagnosis not present

## 2018-03-09 DIAGNOSIS — R531 Weakness: Secondary | ICD-10-CM | POA: Diagnosis not present

## 2018-03-09 DIAGNOSIS — R0602 Shortness of breath: Secondary | ICD-10-CM | POA: Diagnosis not present

## 2018-03-09 DIAGNOSIS — E860 Dehydration: Secondary | ICD-10-CM | POA: Diagnosis not present

## 2018-03-09 DIAGNOSIS — G43909 Migraine, unspecified, not intractable, without status migrainosus: Secondary | ICD-10-CM | POA: Diagnosis not present

## 2018-03-09 DIAGNOSIS — M810 Age-related osteoporosis without current pathological fracture: Secondary | ICD-10-CM | POA: Diagnosis not present

## 2018-03-29 DIAGNOSIS — Z9221 Personal history of antineoplastic chemotherapy: Secondary | ICD-10-CM | POA: Diagnosis not present

## 2018-03-29 DIAGNOSIS — M810 Age-related osteoporosis without current pathological fracture: Secondary | ICD-10-CM | POA: Diagnosis not present

## 2018-03-29 DIAGNOSIS — Z7901 Long term (current) use of anticoagulants: Secondary | ICD-10-CM | POA: Diagnosis not present

## 2018-03-29 DIAGNOSIS — C50111 Malignant neoplasm of central portion of right female breast: Secondary | ICD-10-CM | POA: Diagnosis not present

## 2018-03-29 DIAGNOSIS — R05 Cough: Secondary | ICD-10-CM | POA: Diagnosis not present

## 2018-03-29 DIAGNOSIS — I2699 Other pulmonary embolism without acute cor pulmonale: Secondary | ICD-10-CM | POA: Diagnosis not present

## 2018-03-29 DIAGNOSIS — Z86711 Personal history of pulmonary embolism: Secondary | ICD-10-CM | POA: Diagnosis not present

## 2018-03-29 DIAGNOSIS — C50811 Malignant neoplasm of overlapping sites of right female breast: Secondary | ICD-10-CM | POA: Diagnosis not present

## 2018-04-03 DIAGNOSIS — M810 Age-related osteoporosis without current pathological fracture: Secondary | ICD-10-CM | POA: Diagnosis not present

## 2018-04-03 DIAGNOSIS — M8588 Other specified disorders of bone density and structure, other site: Secondary | ICD-10-CM | POA: Diagnosis not present

## 2018-07-03 DIAGNOSIS — R05 Cough: Secondary | ICD-10-CM | POA: Diagnosis not present

## 2018-07-03 DIAGNOSIS — Z170 Estrogen receptor positive status [ER+]: Secondary | ICD-10-CM | POA: Diagnosis not present

## 2018-07-03 DIAGNOSIS — M810 Age-related osteoporosis without current pathological fracture: Secondary | ICD-10-CM | POA: Diagnosis not present

## 2018-07-03 DIAGNOSIS — C50811 Malignant neoplasm of overlapping sites of right female breast: Secondary | ICD-10-CM | POA: Diagnosis not present

## 2018-07-03 DIAGNOSIS — C50111 Malignant neoplasm of central portion of right female breast: Secondary | ICD-10-CM | POA: Diagnosis not present

## 2018-07-07 DIAGNOSIS — C50811 Malignant neoplasm of overlapping sites of right female breast: Secondary | ICD-10-CM | POA: Diagnosis not present

## 2018-07-07 DIAGNOSIS — M810 Age-related osteoporosis without current pathological fracture: Secondary | ICD-10-CM | POA: Diagnosis not present

## 2018-08-11 DIAGNOSIS — K219 Gastro-esophageal reflux disease without esophagitis: Secondary | ICD-10-CM | POA: Diagnosis not present

## 2018-08-11 DIAGNOSIS — Z79899 Other long term (current) drug therapy: Secondary | ICD-10-CM | POA: Diagnosis not present

## 2018-08-11 DIAGNOSIS — C50911 Malignant neoplasm of unspecified site of right female breast: Secondary | ICD-10-CM | POA: Diagnosis not present

## 2018-08-11 DIAGNOSIS — R739 Hyperglycemia, unspecified: Secondary | ICD-10-CM | POA: Diagnosis not present

## 2018-08-11 DIAGNOSIS — F331 Major depressive disorder, recurrent, moderate: Secondary | ICD-10-CM | POA: Diagnosis not present

## 2018-08-11 DIAGNOSIS — G43709 Chronic migraine without aura, not intractable, without status migrainosus: Secondary | ICD-10-CM | POA: Diagnosis not present

## 2018-08-11 DIAGNOSIS — Z6832 Body mass index (BMI) 32.0-32.9, adult: Secondary | ICD-10-CM | POA: Diagnosis not present

## 2018-08-11 DIAGNOSIS — Z0000 Encounter for general adult medical examination without abnormal findings: Secondary | ICD-10-CM | POA: Diagnosis not present

## 2018-08-11 DIAGNOSIS — E785 Hyperlipidemia, unspecified: Secondary | ICD-10-CM | POA: Diagnosis not present

## 2018-08-11 DIAGNOSIS — E669 Obesity, unspecified: Secondary | ICD-10-CM | POA: Diagnosis not present

## 2018-08-11 DIAGNOSIS — M810 Age-related osteoporosis without current pathological fracture: Secondary | ICD-10-CM | POA: Diagnosis not present

## 2018-08-28 DIAGNOSIS — C50811 Malignant neoplasm of overlapping sites of right female breast: Secondary | ICD-10-CM | POA: Diagnosis not present

## 2018-08-28 DIAGNOSIS — R922 Inconclusive mammogram: Secondary | ICD-10-CM | POA: Diagnosis not present

## 2018-09-27 ENCOUNTER — Other Ambulatory Visit: Payer: Self-pay

## 2018-10-03 DIAGNOSIS — M810 Age-related osteoporosis without current pathological fracture: Secondary | ICD-10-CM | POA: Diagnosis not present

## 2018-10-03 DIAGNOSIS — Z7901 Long term (current) use of anticoagulants: Secondary | ICD-10-CM | POA: Diagnosis not present

## 2018-10-03 DIAGNOSIS — Z86711 Personal history of pulmonary embolism: Secondary | ICD-10-CM | POA: Diagnosis not present

## 2018-10-03 DIAGNOSIS — K219 Gastro-esophageal reflux disease without esophagitis: Secondary | ICD-10-CM | POA: Diagnosis not present

## 2018-10-03 DIAGNOSIS — C50811 Malignant neoplasm of overlapping sites of right female breast: Secondary | ICD-10-CM | POA: Diagnosis not present

## 2018-10-03 DIAGNOSIS — Z853 Personal history of malignant neoplasm of breast: Secondary | ICD-10-CM | POA: Diagnosis not present

## 2018-11-21 DIAGNOSIS — H6122 Impacted cerumen, left ear: Secondary | ICD-10-CM | POA: Diagnosis not present

## 2019-01-02 DIAGNOSIS — H5203 Hypermetropia, bilateral: Secondary | ICD-10-CM | POA: Diagnosis not present

## 2019-01-02 DIAGNOSIS — H2513 Age-related nuclear cataract, bilateral: Secondary | ICD-10-CM | POA: Diagnosis not present

## 2019-01-05 DIAGNOSIS — K219 Gastro-esophageal reflux disease without esophagitis: Secondary | ICD-10-CM | POA: Diagnosis not present

## 2019-01-05 DIAGNOSIS — C50811 Malignant neoplasm of overlapping sites of right female breast: Secondary | ICD-10-CM | POA: Diagnosis not present

## 2019-01-05 DIAGNOSIS — Z86711 Personal history of pulmonary embolism: Secondary | ICD-10-CM | POA: Diagnosis not present

## 2019-01-05 DIAGNOSIS — Z853 Personal history of malignant neoplasm of breast: Secondary | ICD-10-CM | POA: Diagnosis not present

## 2019-01-05 DIAGNOSIS — M810 Age-related osteoporosis without current pathological fracture: Secondary | ICD-10-CM | POA: Diagnosis not present

## 2019-01-22 DIAGNOSIS — M545 Low back pain: Secondary | ICD-10-CM | POA: Diagnosis not present

## 2019-01-22 DIAGNOSIS — Z6833 Body mass index (BMI) 33.0-33.9, adult: Secondary | ICD-10-CM | POA: Diagnosis not present

## 2019-01-22 DIAGNOSIS — M1990 Unspecified osteoarthritis, unspecified site: Secondary | ICD-10-CM | POA: Diagnosis not present

## 2019-01-24 DIAGNOSIS — M545 Low back pain: Secondary | ICD-10-CM | POA: Diagnosis not present

## 2019-01-24 DIAGNOSIS — M25551 Pain in right hip: Secondary | ICD-10-CM | POA: Diagnosis not present

## 2019-01-24 DIAGNOSIS — M6281 Muscle weakness (generalized): Secondary | ICD-10-CM | POA: Diagnosis not present

## 2019-01-24 DIAGNOSIS — M2560 Stiffness of unspecified joint, not elsewhere classified: Secondary | ICD-10-CM | POA: Diagnosis not present

## 2019-01-24 DIAGNOSIS — R2689 Other abnormalities of gait and mobility: Secondary | ICD-10-CM | POA: Diagnosis not present

## 2019-01-31 DIAGNOSIS — M159 Polyosteoarthritis, unspecified: Secondary | ICD-10-CM | POA: Diagnosis not present

## 2019-01-31 DIAGNOSIS — C50911 Malignant neoplasm of unspecified site of right female breast: Secondary | ICD-10-CM | POA: Diagnosis not present

## 2019-01-31 DIAGNOSIS — E669 Obesity, unspecified: Secondary | ICD-10-CM | POA: Diagnosis not present

## 2019-01-31 DIAGNOSIS — R739 Hyperglycemia, unspecified: Secondary | ICD-10-CM | POA: Diagnosis not present

## 2019-01-31 DIAGNOSIS — Z6832 Body mass index (BMI) 32.0-32.9, adult: Secondary | ICD-10-CM | POA: Diagnosis not present

## 2019-01-31 DIAGNOSIS — M810 Age-related osteoporosis without current pathological fracture: Secondary | ICD-10-CM | POA: Diagnosis not present

## 2019-02-08 DIAGNOSIS — M6281 Muscle weakness (generalized): Secondary | ICD-10-CM | POA: Diagnosis not present

## 2019-02-08 DIAGNOSIS — M2560 Stiffness of unspecified joint, not elsewhere classified: Secondary | ICD-10-CM | POA: Diagnosis not present

## 2019-02-08 DIAGNOSIS — M25551 Pain in right hip: Secondary | ICD-10-CM | POA: Diagnosis not present

## 2019-02-08 DIAGNOSIS — R2689 Other abnormalities of gait and mobility: Secondary | ICD-10-CM | POA: Diagnosis not present

## 2019-02-08 DIAGNOSIS — M545 Low back pain: Secondary | ICD-10-CM | POA: Diagnosis not present

## 2019-02-12 IMAGING — MG MM PLC BREAST LOC DEV 1ST LESION INC*R*
3 series · 3 of 3 positions shown · non-contrast
Comparison: Previous exam(s).

CLINICAL DATA: 72-year-old female for radioactive seed localization
of 2 areas of invasive lobular carcinoma within the UPPER RIGHT
breast, prior to RIGHT lumpectomies..

EXAM:
MAMMOGRAPHIC GUIDED RADIOACTIVE SEED LOCALIZATION OF THE RIGHT
BREAST X 2

[R CC]
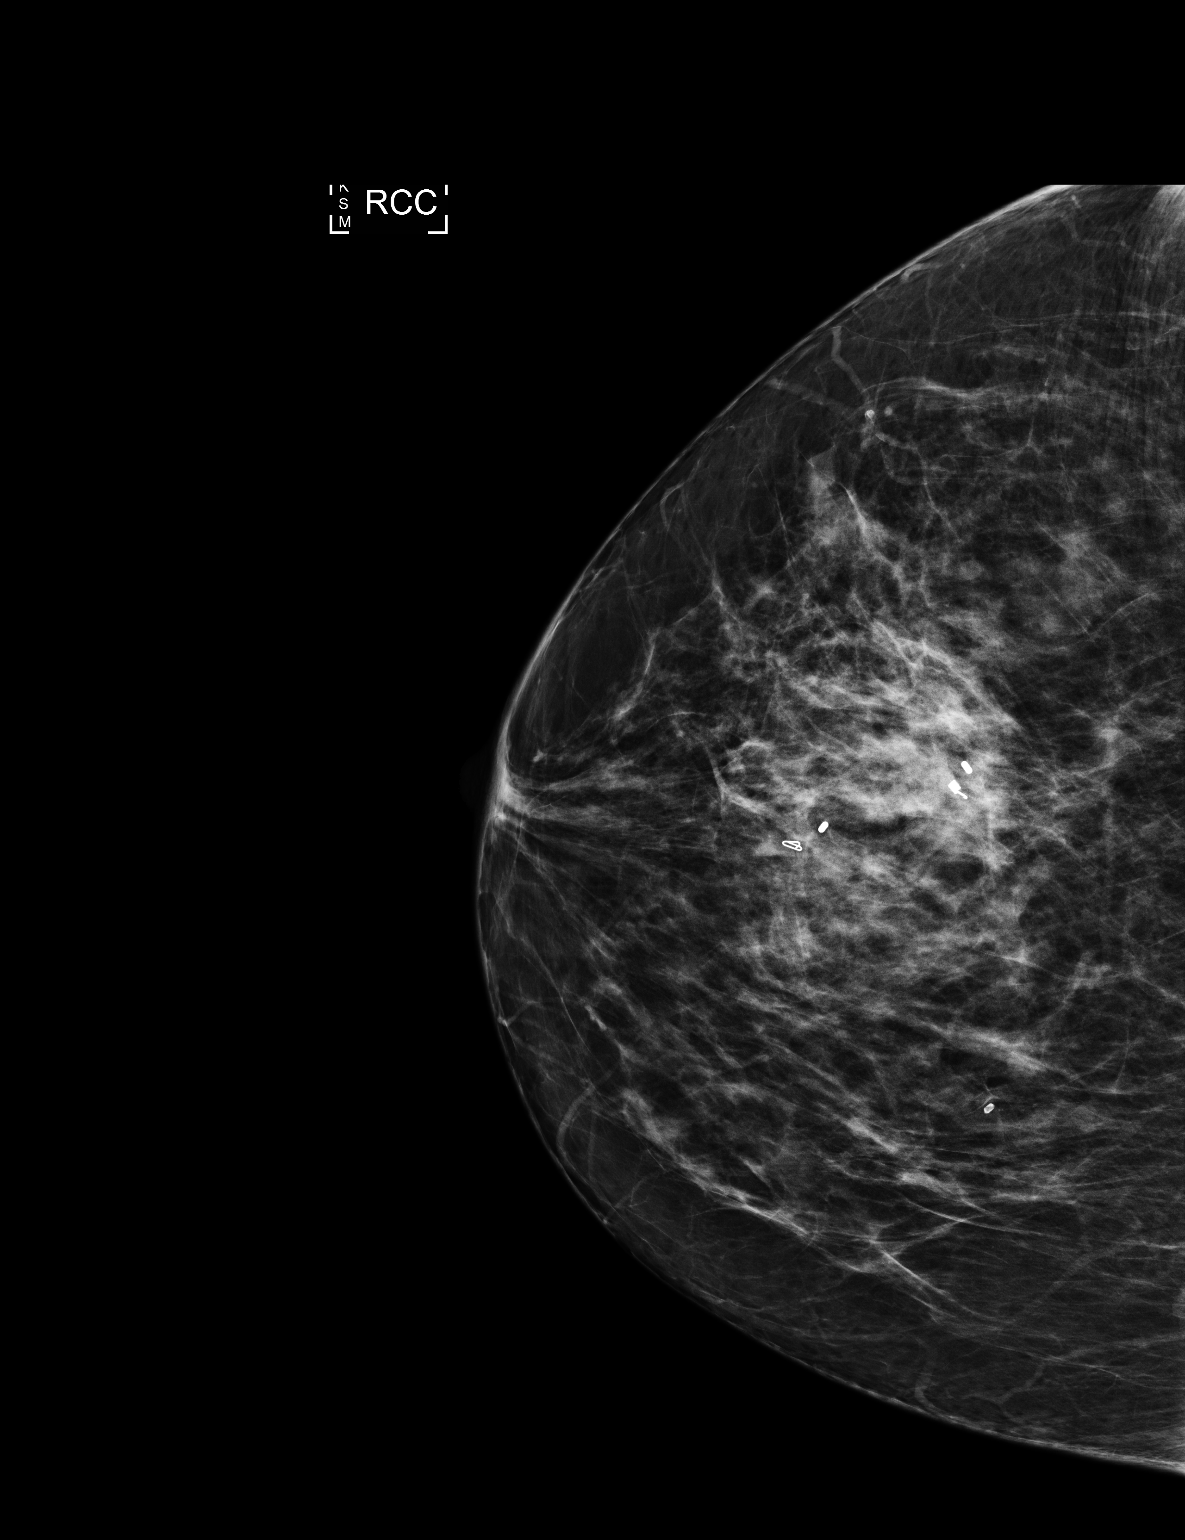

[R LM (1 of 2)]
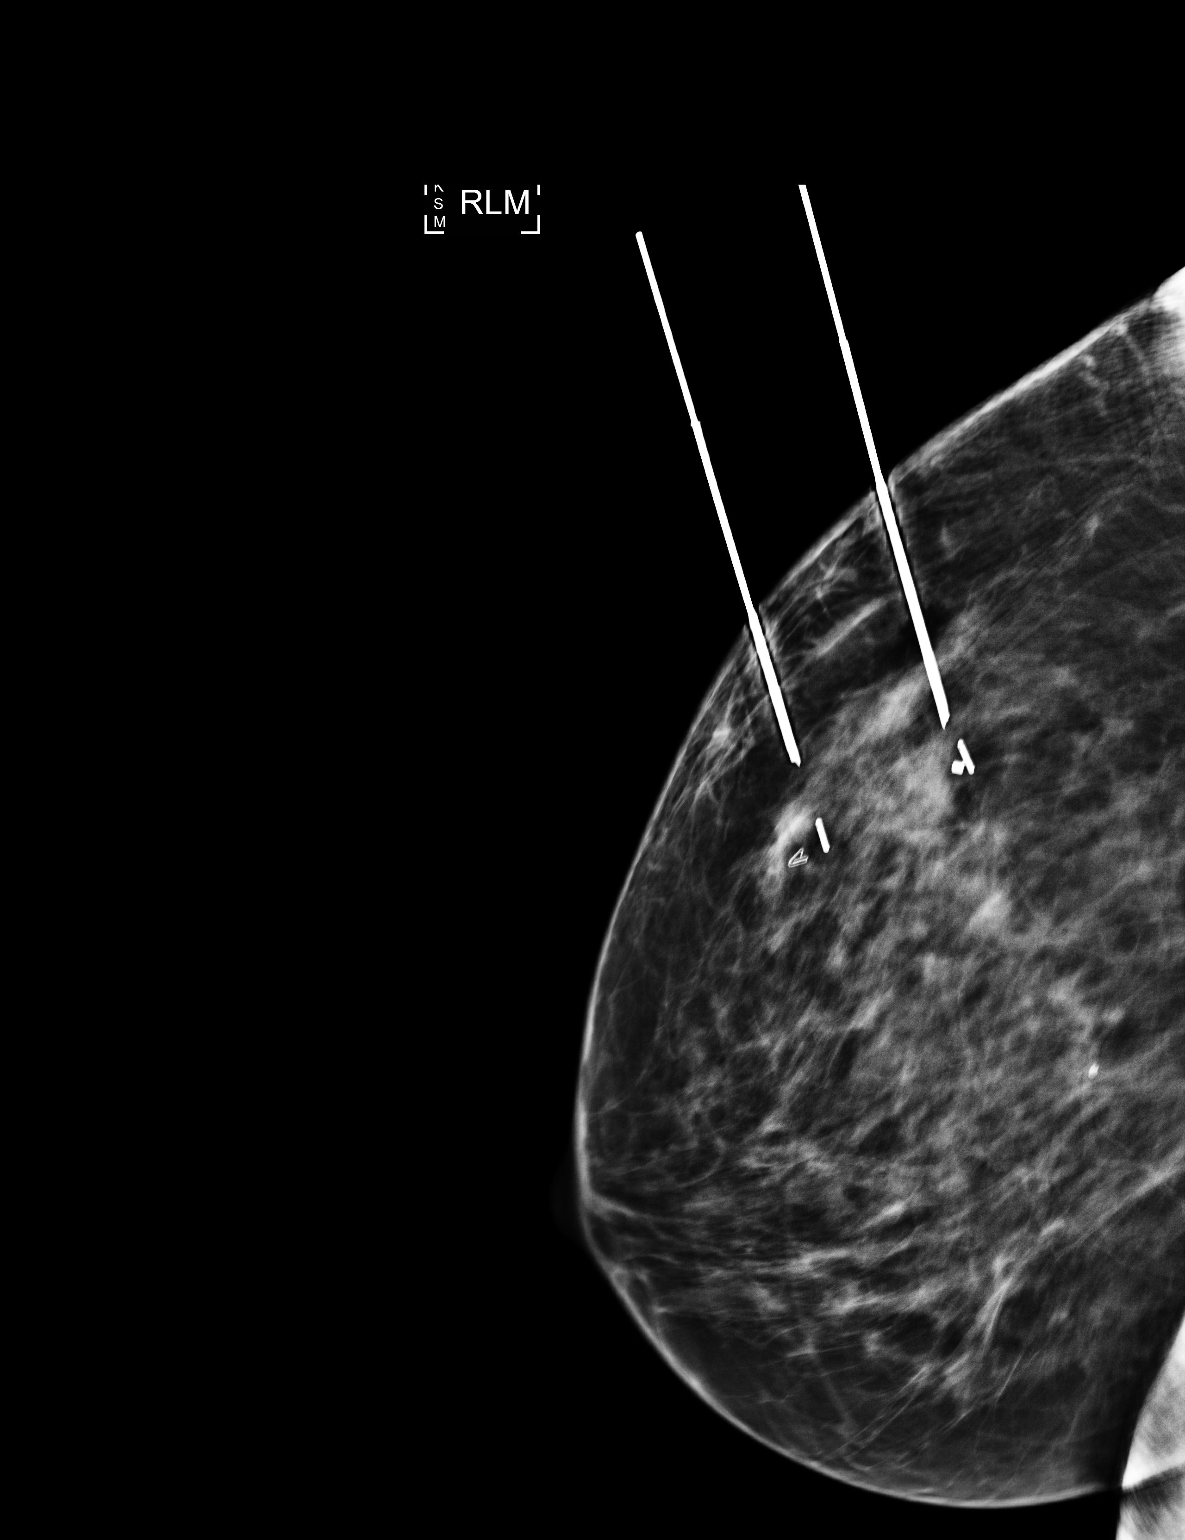

[R LM (2 of 2)]
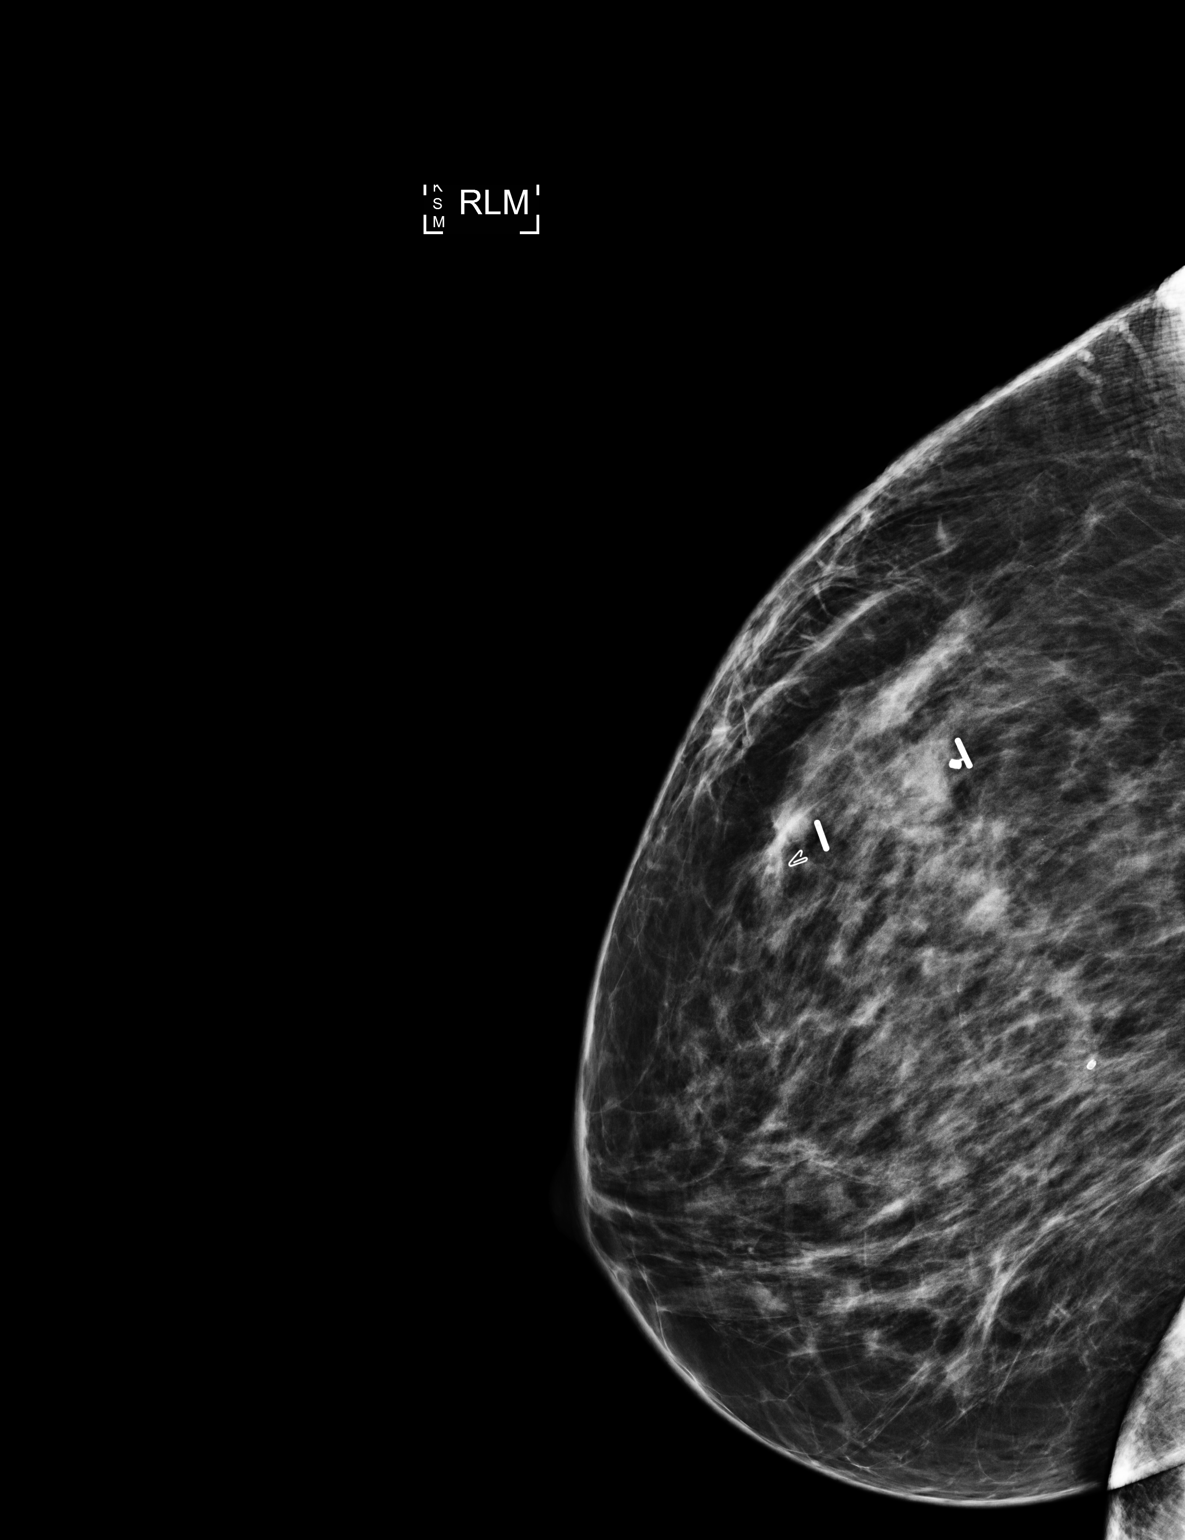

[3 of 3 positions shown; findings below may reference images not displayed]

FINDINGS: Patient presents for radioactive seed localizations prior to RIGHT
lumpectomies. I met with the patient and we discussed the procedure
of seed localization including benefits and alternatives. We
discussed the high likelihood of a successful procedure. We
discussed the risks of the procedure including infection, bleeding,
tissue injury and further surgery. We discussed the low dose of
radioactivity involved in the procedure. Informed, written consent
was given.

The usual time-out protocol was performed immediately prior to the
procedure.

Radioactive seed localization #1 (COIL clip):

Using mammographic guidance, sterile technique, 1% lidocaine and an
J-V9V radioactive seed, the COIL clip was localized using a SUPERIOR
approach. The follow-up mammogram images confirm the seed in the
expected location and were marked for Dr. Myriam.

Follow-up survey of the patient confirms presence of the radioactive
seed.

Order number of J-[id number] seed:  [id number].

Total activity:  0.248 millicuries.  Reference Date: 09/27/2017

Radioactive seed localization #2 (HEART clip):

Using mammographic guidance, sterile technique, 1% lidocaine and an
J-V9V radioactive seed, the HEART clip was localized using a
SUPERIOR approach. The follow-up mammogram images confirm the seed
in the expected location and were marked for Dr. Myriam.

Follow-up survey of the patient confirms presence of the radioactive
seed.

Order number of J-[id number] seed:  [id number].

Total activity:  0.248 millicuries.  Reference Date: 09/27/2017

The patient tolerated the procedures well and was released from the
[REDACTED]. She was given instructions regarding seed removal.
IMPRESSION: Radioactive seed localization x 2 of the RIGHT breast. No apparent
complications.

## 2019-03-07 DIAGNOSIS — J22 Unspecified acute lower respiratory infection: Secondary | ICD-10-CM | POA: Diagnosis not present

## 2019-03-13 DIAGNOSIS — R05 Cough: Secondary | ICD-10-CM | POA: Diagnosis not present

## 2019-04-06 DIAGNOSIS — K219 Gastro-esophageal reflux disease without esophagitis: Secondary | ICD-10-CM | POA: Diagnosis not present

## 2019-04-06 DIAGNOSIS — Z853 Personal history of malignant neoplasm of breast: Secondary | ICD-10-CM | POA: Diagnosis not present

## 2019-04-06 DIAGNOSIS — M810 Age-related osteoporosis without current pathological fracture: Secondary | ICD-10-CM | POA: Diagnosis not present

## 2019-04-06 DIAGNOSIS — R05 Cough: Secondary | ICD-10-CM | POA: Diagnosis not present

## 2019-06-26 DIAGNOSIS — Z853 Personal history of malignant neoplasm of breast: Secondary | ICD-10-CM | POA: Diagnosis not present

## 2019-06-26 DIAGNOSIS — M810 Age-related osteoporosis without current pathological fracture: Secondary | ICD-10-CM | POA: Diagnosis not present

## 2019-06-26 DIAGNOSIS — Z86711 Personal history of pulmonary embolism: Secondary | ICD-10-CM | POA: Diagnosis not present

## 2019-07-21 IMAGING — CR DG CHEST 1V
2 series · 2 of 2 positions shown · non-contrast
Comparison: 10/03/14

CLINICAL DATA: Status post port placement

EXAM:
CHEST  1 VIEW

[x chest ap (1 of 2)]
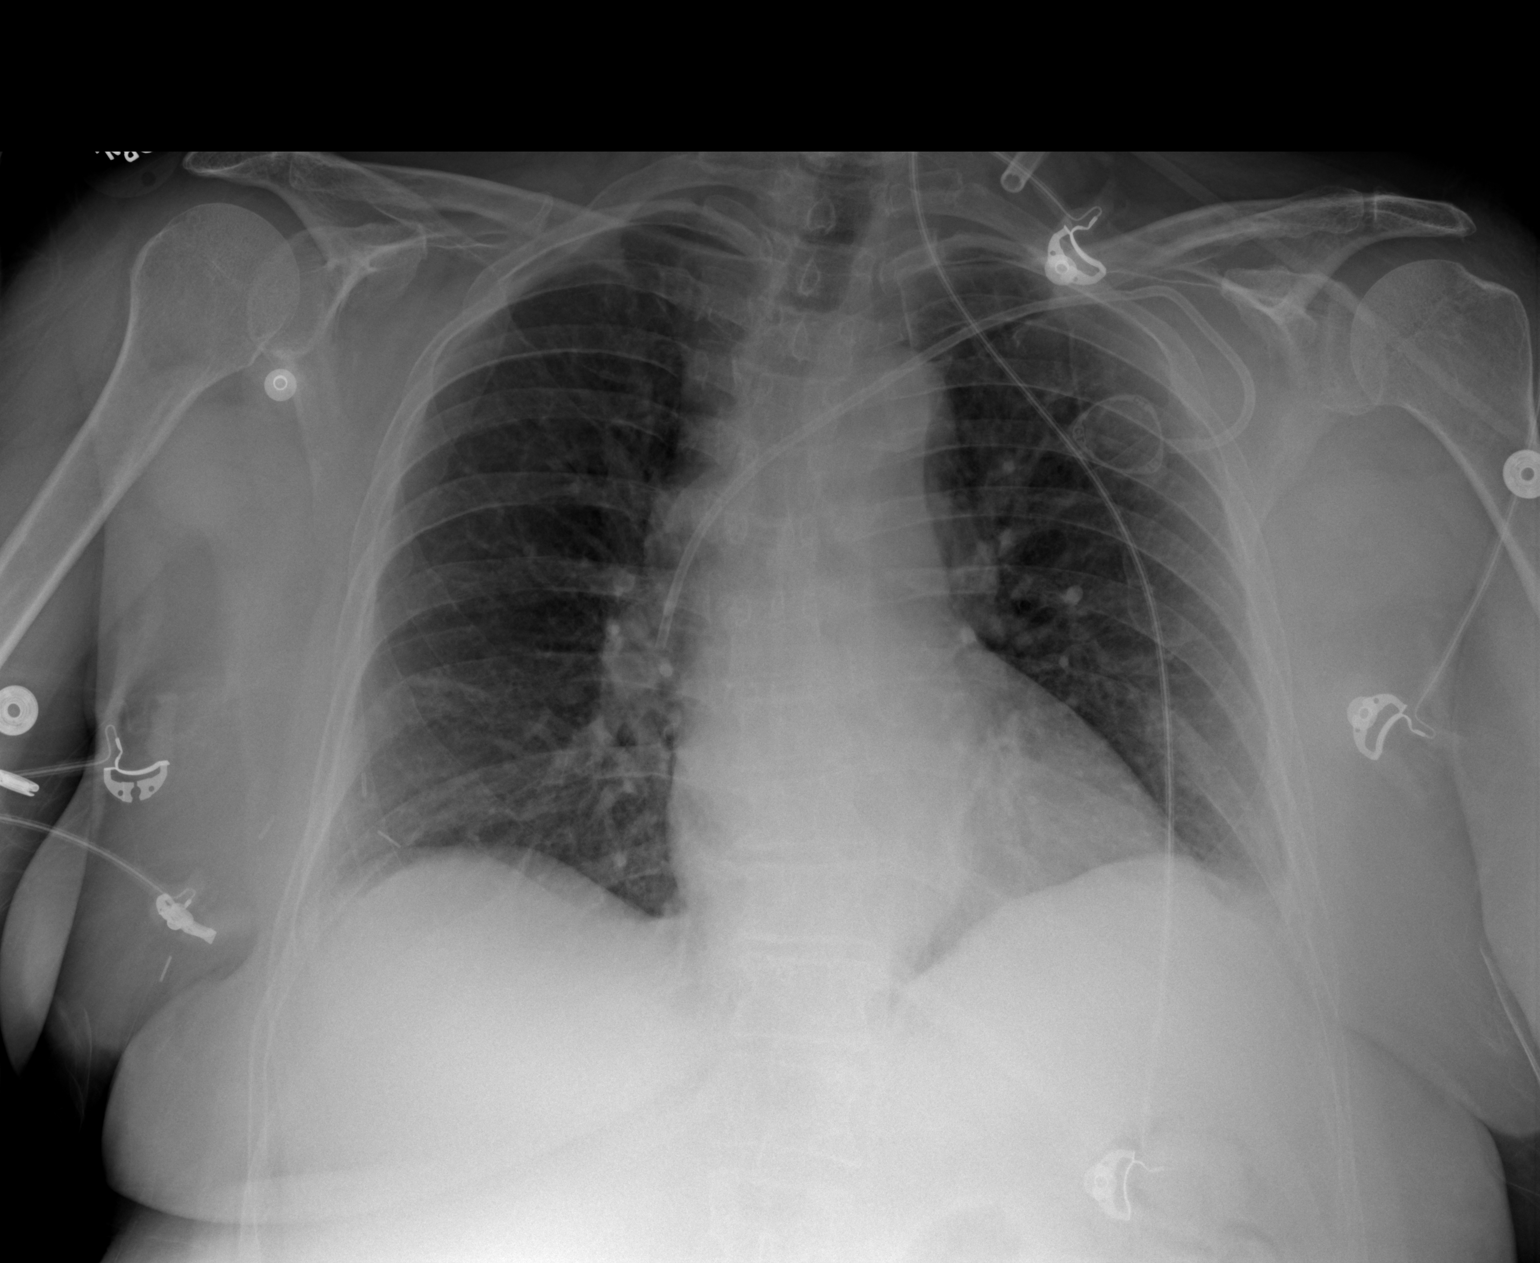

[x chest ap (2 of 2)]
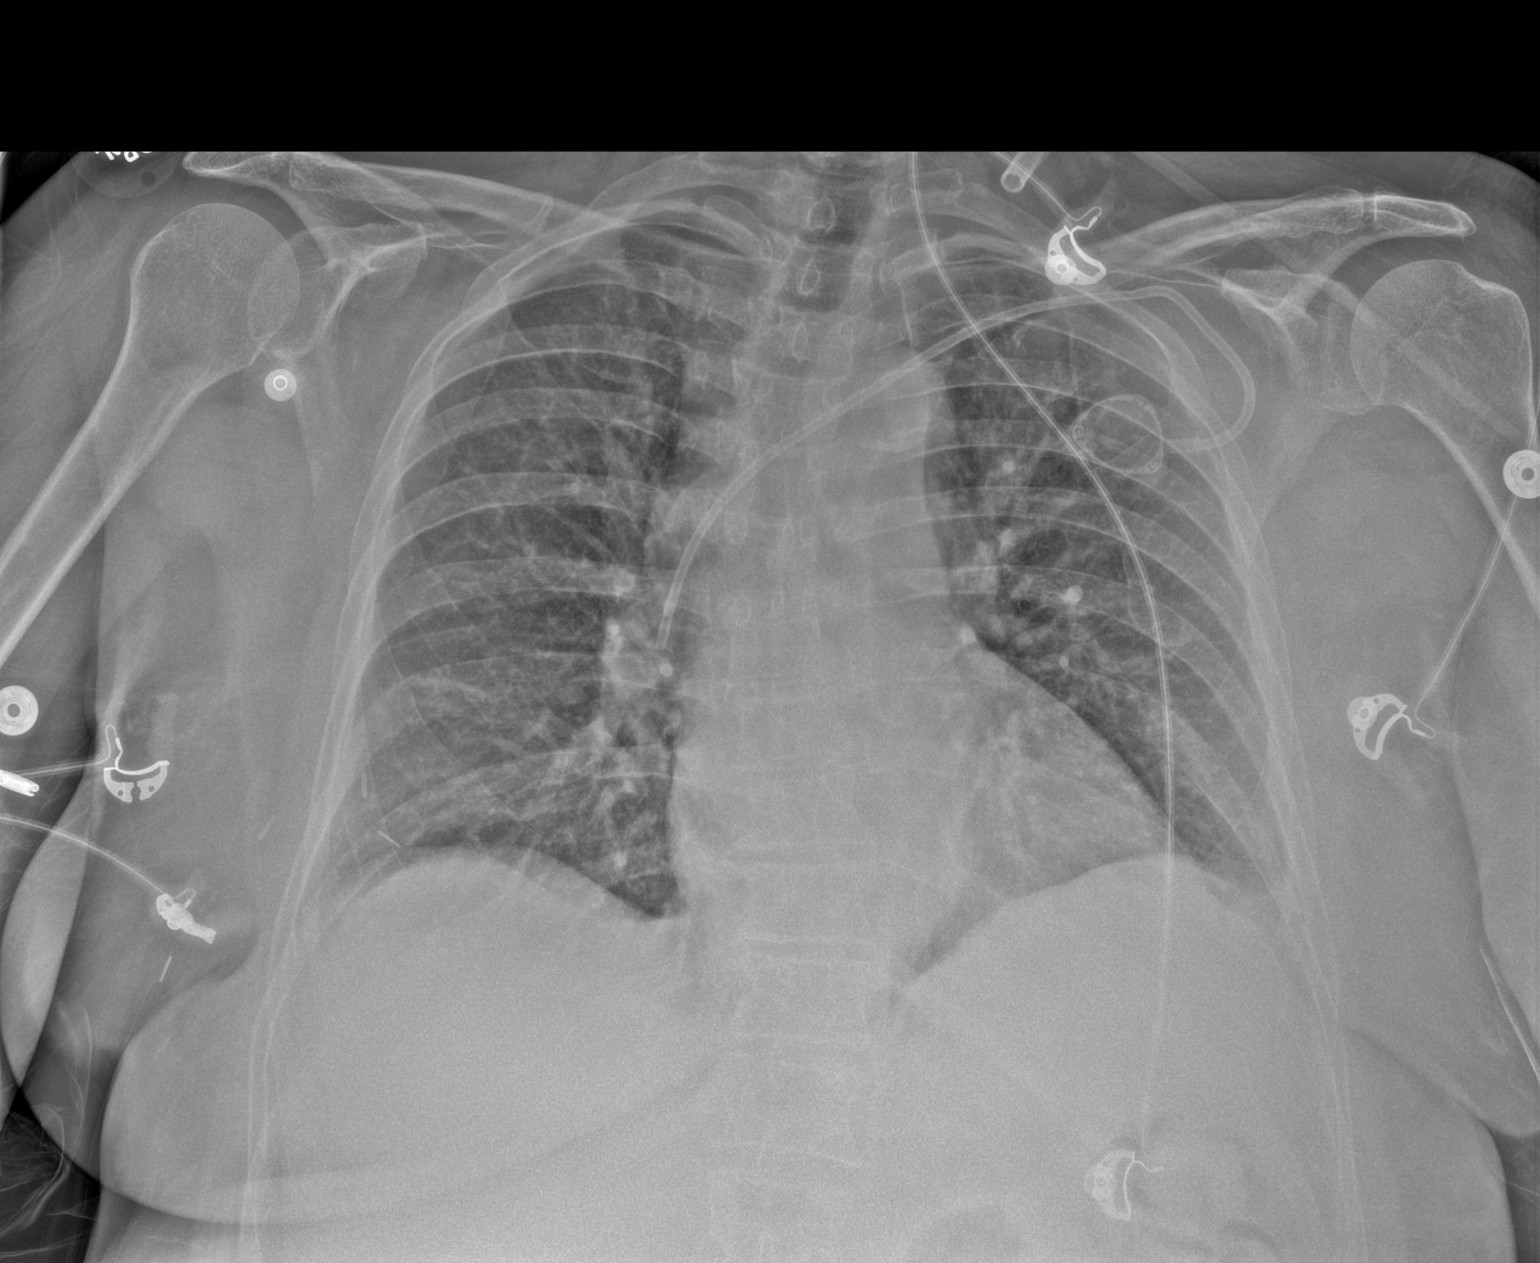

[2 of 2 positions shown; findings below may reference images not displayed]

FINDINGS: Cardiac shadow is within normal limits. The lungs are well aerated
bilaterally. No focal infiltrate or sizable effusion is seen. No
definitive pneumothorax is seen. Left-sided chest wall port is noted
with catheter tip in the mid superior vena cava. Mild catheter
deformity is noted at the vein entry site likely related to
compression from the clavicle.
IMPRESSION: No pneumothorax following port placement.

## 2019-08-02 DIAGNOSIS — Z452 Encounter for adjustment and management of vascular access device: Secondary | ICD-10-CM | POA: Insufficient documentation

## 2019-08-06 DIAGNOSIS — M810 Age-related osteoporosis without current pathological fracture: Secondary | ICD-10-CM | POA: Diagnosis not present

## 2019-08-06 DIAGNOSIS — C50811 Malignant neoplasm of overlapping sites of right female breast: Secondary | ICD-10-CM | POA: Diagnosis not present

## 2019-08-10 DIAGNOSIS — Z01812 Encounter for preprocedural laboratory examination: Secondary | ICD-10-CM | POA: Diagnosis not present

## 2019-08-10 DIAGNOSIS — Z20822 Contact with and (suspected) exposure to covid-19: Secondary | ICD-10-CM | POA: Diagnosis not present

## 2019-08-10 DIAGNOSIS — Z452 Encounter for adjustment and management of vascular access device: Secondary | ICD-10-CM | POA: Diagnosis not present

## 2019-08-17 DIAGNOSIS — Z452 Encounter for adjustment and management of vascular access device: Secondary | ICD-10-CM | POA: Diagnosis not present

## 2019-08-17 DIAGNOSIS — E669 Obesity, unspecified: Secondary | ICD-10-CM | POA: Diagnosis not present

## 2019-08-17 DIAGNOSIS — E785 Hyperlipidemia, unspecified: Secondary | ICD-10-CM | POA: Diagnosis not present

## 2019-08-17 DIAGNOSIS — Z6832 Body mass index (BMI) 32.0-32.9, adult: Secondary | ICD-10-CM | POA: Diagnosis not present

## 2019-08-17 DIAGNOSIS — Z79899 Other long term (current) drug therapy: Secondary | ICD-10-CM | POA: Diagnosis not present

## 2019-08-17 DIAGNOSIS — K5909 Other constipation: Secondary | ICD-10-CM | POA: Diagnosis not present

## 2019-08-17 DIAGNOSIS — M1990 Unspecified osteoarthritis, unspecified site: Secondary | ICD-10-CM | POA: Diagnosis not present

## 2019-08-17 DIAGNOSIS — E119 Type 2 diabetes mellitus without complications: Secondary | ICD-10-CM | POA: Diagnosis not present

## 2019-08-17 DIAGNOSIS — Z853 Personal history of malignant neoplasm of breast: Secondary | ICD-10-CM | POA: Diagnosis not present

## 2019-08-17 DIAGNOSIS — K219 Gastro-esophageal reflux disease without esophagitis: Secondary | ICD-10-CM | POA: Diagnosis not present

## 2019-08-31 DIAGNOSIS — I714 Abdominal aortic aneurysm, without rupture: Secondary | ICD-10-CM | POA: Diagnosis not present

## 2019-08-31 DIAGNOSIS — F331 Major depressive disorder, recurrent, moderate: Secondary | ICD-10-CM | POA: Diagnosis not present

## 2019-08-31 DIAGNOSIS — E669 Obesity, unspecified: Secondary | ICD-10-CM | POA: Diagnosis not present

## 2019-08-31 DIAGNOSIS — G43709 Chronic migraine without aura, not intractable, without status migrainosus: Secondary | ICD-10-CM | POA: Diagnosis not present

## 2019-08-31 DIAGNOSIS — C50911 Malignant neoplasm of unspecified site of right female breast: Secondary | ICD-10-CM | POA: Diagnosis not present

## 2019-08-31 DIAGNOSIS — E1129 Type 2 diabetes mellitus with other diabetic kidney complication: Secondary | ICD-10-CM | POA: Diagnosis not present

## 2019-08-31 DIAGNOSIS — Z79899 Other long term (current) drug therapy: Secondary | ICD-10-CM | POA: Diagnosis not present

## 2019-08-31 DIAGNOSIS — K219 Gastro-esophageal reflux disease without esophagitis: Secondary | ICD-10-CM | POA: Diagnosis not present

## 2019-08-31 DIAGNOSIS — Z6832 Body mass index (BMI) 32.0-32.9, adult: Secondary | ICD-10-CM | POA: Diagnosis not present

## 2019-08-31 DIAGNOSIS — Z0000 Encounter for general adult medical examination without abnormal findings: Secondary | ICD-10-CM | POA: Diagnosis not present

## 2019-08-31 DIAGNOSIS — E785 Hyperlipidemia, unspecified: Secondary | ICD-10-CM | POA: Diagnosis not present

## 2019-08-31 DIAGNOSIS — M810 Age-related osteoporosis without current pathological fracture: Secondary | ICD-10-CM | POA: Diagnosis not present

## 2019-09-04 DIAGNOSIS — K5732 Diverticulitis of large intestine without perforation or abscess without bleeding: Secondary | ICD-10-CM | POA: Diagnosis not present

## 2019-09-04 DIAGNOSIS — Z6832 Body mass index (BMI) 32.0-32.9, adult: Secondary | ICD-10-CM | POA: Diagnosis not present

## 2019-09-04 DIAGNOSIS — E669 Obesity, unspecified: Secondary | ICD-10-CM | POA: Diagnosis not present

## 2019-09-11 ENCOUNTER — Ambulatory Visit: Payer: Self-pay | Admitting: Podiatry

## 2019-09-12 DIAGNOSIS — C50811 Malignant neoplasm of overlapping sites of right female breast: Secondary | ICD-10-CM | POA: Diagnosis not present

## 2019-09-12 DIAGNOSIS — R922 Inconclusive mammogram: Secondary | ICD-10-CM | POA: Diagnosis not present

## 2019-09-12 DIAGNOSIS — N6489 Other specified disorders of breast: Secondary | ICD-10-CM | POA: Diagnosis not present

## 2019-09-13 ENCOUNTER — Ambulatory Visit: Payer: Self-pay | Admitting: Sports Medicine

## 2019-09-27 ENCOUNTER — Ambulatory Visit: Payer: Self-pay | Admitting: Sports Medicine

## 2019-10-03 DIAGNOSIS — Z79811 Long term (current) use of aromatase inhibitors: Secondary | ICD-10-CM | POA: Diagnosis not present

## 2019-10-03 DIAGNOSIS — Z86711 Personal history of pulmonary embolism: Secondary | ICD-10-CM | POA: Diagnosis not present

## 2019-10-03 DIAGNOSIS — Z853 Personal history of malignant neoplasm of breast: Secondary | ICD-10-CM | POA: Diagnosis not present

## 2019-10-03 DIAGNOSIS — M810 Age-related osteoporosis without current pathological fracture: Secondary | ICD-10-CM | POA: Diagnosis not present

## 2019-10-03 DIAGNOSIS — C50811 Malignant neoplasm of overlapping sites of right female breast: Secondary | ICD-10-CM | POA: Diagnosis not present

## 2020-01-09 DIAGNOSIS — H6122 Impacted cerumen, left ear: Secondary | ICD-10-CM | POA: Diagnosis not present

## 2020-01-09 DIAGNOSIS — H6692 Otitis media, unspecified, left ear: Secondary | ICD-10-CM | POA: Diagnosis not present

## 2020-01-18 NOTE — Progress Notes (Signed)
PT STABLE AT TIME OF DISCHARGE 

## 2020-01-21 NOTE — Progress Notes (Signed)
PT STABLE AT TIME OF DISCHARGE 

## 2020-01-28 NOTE — Progress Notes (Signed)
Sheena Joyce  [address] [place],  [place]  [address] [phone]  Clinic Day:  01/29/2020  Referring physician: Emmaline Kluver, *   CHIEF COMPLAINT:  CC: 74 year old woman with stage IIB (T2 N97mc M0) hormone positive breast cancer diagnosed in July 2019.  Current Treatment:  Anastrazole   HISTORY OF PRESENT ILLNESS:  Sheena MODESTEis a 74y.o. female with a history of stage IIB (T2 N180m M0) hormone positive breast cancer diagnosed in July 2019.  Her history dates back to May 2019 when a breast cancer was discovered on a screening mammogram, which revealed a possible mass in the right breast.  Diagnostic right mammogram revealed a mass in the superior central right breast, which measure 1.3 cm on ultrasound.  There was also a second similar mass measuring 1.1 cm, as well as borderline cortical thickening of a single right axilla.  Biopsies of both lesions were done.  The first revealed a grade 1, invasive lobular carcinoma along with lobular carcinoma in situ and the other biopsy site revealed a 5 mm focus of proliferative fibrocystic changes.  Estrogen and progesterone receptors were highly positive and HER 2 negative.  Ki 67 was 1%.  MRI  breast confirmed multifocal disease with adjacent enhancing masses in the upper inner quadrant of the right breast.  No other malignancy was found in either breast and no pathologic lymphadenopathy was seen.  She was treated with right breast lumpectomy in August 2019.  Pathology revealed a 4.5 cm, grade 2, invasive lobular carcinoma with lobular carcinoma in situ.  Two lymph nodes had micrometastases and 2 lymph nodes were negative for metastasis.  Margins were clear.  No lymphovascular or perineural invasion was seen.  EndoPredict testing revealed a high risk recurrence score.  Due to her personal and family history of malignancy, she underwent testing for hereditary cancer syndromes with the [employer] myRisk hereditary  cancer panel test.  This did not reveal any clinically significant mutation or variant of uncertain significance.  She was recommended for adjuvant chemotherapy with docetaxel/cyclophosphamide due to her high risk EndoPredict score.  She had difficulty tolerating treatment due to severe bone pain and headaches.  She then developed a subsegmental right lower lobe pulmonary embolism in October 2019.  She was initially treated with enoxaparin 1 milligram/kilogram twice daily for 1 month, then 1.5 milligrams/kilogram daily.  She was switched to rivaroxaban in December.  She completed 4 cycles of docetaxel/cyclophosphamide in December 2019. Due to toxicities, the chemotherapy doses were decreased by 20% with her 2nd cycle.  She required IV fluid support and pain medication throughout chemotherapy.   Bone density scan in February 2020 revealed osteoporosis, with a T-score of -2.2 of the spine for osteopenia and -2.6 for the right femur.  We really do not have a comparison study since 2012 and she had osteoporosis at that time, so this not a new problem, but has gone untreated.  She did not tolerate alendronate previously.  She is on calcium with vitamin-D 1500 mg daily.  She was started on anastrozole 1 mg daily at the end of January 2020.  Since completion of chemotherapy, the patient has complained of cough.  We recommended treatment of the osteoporosis with Prolia and she started that in May 2020 and had an increase in bone pain.  She continued to complain of bone pain in August.  She was seen in November 2020 prior to 2nd dose of Prolia, at which time she reported  back pain.  MRI through her orthopedic office apparently revealed degenerative disc changes.  She also reported chronic nonproductive cough.  We recommended she continue pantoprazole 40 mg daily, as we felt acid reflux could be contributing to her cough.  She had insomnia, despite lorazepam at bedtime.  She was given trazodone 50 mg 1-2 at bedtime as  needed, but was not taking it regularly.  She reported a cough for 2 months when we saw her in February, which finally resolved after a 2nd course of antibiotics and Nasonex.   Annual bilateral mammogram and left breast ultrasound from July was clear.   INTERVAL HISTORY:  Sheena Joyce is seen in the  clinic for follow up of her stage IIB hormone positive breast cancer. She denies fever, chills, night sweats, or other signs of infection. She denies cardiorespiratory and gastrointestinal issues. She  denies pain. Her appetite is good. Her weight has increased 10 pounds over last 3 months.She relates this weight gain to taking care of her husband who was recently diagnosed with progressing dementia. She states she is eating more and exercising less. She is requesting information regarding custom prosthesis since her lumpectomy.   REVIEW OF SYSTEMS:  Review of Systems  Constitutional: Negative for appetite change, chills, diaphoresis, fatigue, fever and unexpected weight change.  HENT:   Negative for hearing loss, lump/mass, mouth sores, nosebleeds, sore throat, tinnitus, trouble swallowing and voice change.   Eyes: Negative for eye problems and icterus.  Respiratory: Negative for chest tightness, cough, hemoptysis, shortness of breath and wheezing.   Cardiovascular: Negative for chest pain, leg swelling and palpitations.  Gastrointestinal: Negative for abdominal distention, abdominal pain, blood in stool, constipation, diarrhea, nausea, rectal pain and vomiting.  Endocrine: Negative for hot flashes.  Genitourinary: Negative for bladder incontinence, difficulty urinating, dyspareunia, dysuria, frequency, hematuria and nocturia.   Musculoskeletal: Negative for arthralgias, back pain, flank pain, gait problem, myalgias, neck pain and neck stiffness.  Skin: Negative for itching, rash and wound.  Neurological: Negative for dizziness, extremity weakness, gait problem, headaches, light-headedness, numbness,  seizures and speech difficulty.  Hematological: Negative for adenopathy. Does not bruise/bleed easily.  Psychiatric/Behavioral: Negative for confusion, decreased concentration, depression, sleep disturbance and suicidal ideas. The patient is not nervous/anxious.      VITALS:  There were no vitals taken for this visit.  Wt Readings from Last 3 Encounters:  01/29/20 173 lb 1.6 oz (78.5 kg)  10/03/19 163 lb 5 oz (74.1 kg)  10/03/19 163 lb 5 oz (74.1 kg)    There is no height or weight on file to calculate BMI.  Performance status (ECOG): 1 - Symptomatic but completely ambulatory  PHYSICAL EXAM:  Physical Exam Constitutional:      General: She is not in acute distress.    Appearance: Normal appearance. She is normal weight. She is not ill-appearing, toxic-appearing or diaphoretic.  HENT:     Head: Normocephalic and atraumatic.     Right Ear: Tympanic membrane, ear canal and external ear normal. There is no impacted cerumen.     Left Ear: Tympanic membrane, ear canal and external ear normal. There is no impacted cerumen.     Nose: Nose normal. No congestion or rhinorrhea.     Mouth/Throat:     Mouth: Mucous membranes are moist.     Pharynx: No oropharyngeal exudate or posterior oropharyngeal erythema.  Eyes:     General: No scleral icterus.       Right eye: No discharge.  Left eye: No discharge.     Extraocular Movements: Extraocular movements intact.     Conjunctiva/sclera: Conjunctivae normal.     Pupils: Pupils are equal, round, and reactive to light.  Neck:     Vascular: No carotid bruit.  Cardiovascular:     Rate and Rhythm: Normal rate and regular rhythm.     Pulses: Normal pulses.     Heart sounds: No murmur heard.  No friction rub. No gallop.   Pulmonary:     Effort: Pulmonary effort is normal. No respiratory distress.     Breath sounds: Normal breath sounds. No stridor. No wheezing, rhonchi or rales.  Chest:     Chest wall: No mass, lacerations, deformity,  swelling, tenderness, crepitus or edema. There is no dullness to percussion.     Breasts: Breasts are symmetrical.        Right: Normal. No swelling, bleeding, inverted nipple, mass, nipple discharge, skin change or tenderness.        Left: Normal. No swelling, bleeding, inverted nipple, mass, nipple discharge, skin change or tenderness.  Abdominal:     General: Abdomen is flat. Bowel sounds are normal. There is no distension.     Palpations: Abdomen is soft. There is no mass.     Tenderness: There is no abdominal tenderness. There is no right CVA tenderness, left CVA tenderness, guarding or rebound.     Hernia: No hernia is present.  Musculoskeletal:        General: No swelling, tenderness, deformity or signs of injury. Normal range of motion.     Cervical back: Normal range of motion and neck supple. No rigidity or tenderness.     Right lower leg: No edema.     Left lower leg: No edema.  Lymphadenopathy:     Cervical: No cervical adenopathy.     Upper Body:     Right upper body: No supraclavicular, axillary or pectoral adenopathy.     Left upper body: No supraclavicular, axillary or pectoral adenopathy.  Skin:    General: Skin is warm and dry.     Capillary Refill: Capillary refill takes less than 2 seconds.     Coloration: Skin is not jaundiced or pale.     Findings: No bruising, erythema, lesion or rash.  Neurological:     General: No focal deficit present.     Mental Status: She is alert and oriented to person, place, and time. Mental status is at baseline.     Cranial Nerves: No cranial nerve deficit.     Sensory: No sensory deficit.     Motor: No weakness.     Coordination: Coordination normal.     Gait: Gait normal.     Deep Tendon Reflexes: Reflexes normal.  Psychiatric:        Mood and Affect: Mood normal.        Behavior: Behavior normal.        Thought Content: Thought content normal.        Judgment: Judgment normal.    Lymph nodes:   There is no cervical,  clavicular, axillary or inguinal lymphadenopathy.   LABS:   CBC Latest Ref Rng & Units 01/29/2020 12/07/2017 10/17/2017  WBC - 6.4 5.3 8.0  Hemoglobin 12.0 - 16.0 13.5 12.8 13.6  Hematocrit 36 - 46 41 40.6 44.2  Platelets 150 - 399 257 296 268   CMP Latest Ref Rng & Units 01/29/2020 12/07/2017 10/17/2017  Glucose 70 - 99 mg/dL - 115(H) 102(H)  BUN 4 -  _0 Creatinine 0.5 - 1.1 0.9 0.89 0.88  Sodium 137 - 147 139 138 137  Potassium 3.4 - 5.3 4.0 3.8 3.8  Chloride 99 - 108 105 106 101  CO2 13 - 22 28(A) 24 29  Calcium 8.7 - 10.7 9.2 8.8(L) 9.3  Total Protein 6.5 - 8.1 g/dL - - 6.6  Total Bilirubin 0.3 - 1.2 mg/dL - - 0.5  Alkaline Phos 25 - 125 62 - 64  AST 13 - 35 24 - 22  ALT 7 - 35 18 - 17     No results found for: CEA1 / No results found for: CEA1 No results found for: PSA1 No results found for: MWN027 No results found for: OZD664  No results found for: TOTALPROTELP, ALBUMINELP, A1GS, A2GS, BETS, BETA2SER, GAMS, MSPIKE, SPEI No results found for: TIBC, FERRITIN, IRONPCTSAT No results found for: LDH  STUDIES:  No results found.    HISTORY:   Past Medical History:  Diagnosis Date   Anxiety    Arthritis    Cancer of overlapping sites of right female breast ([place]) 06/01/4740   Complication of anesthesia    "became unresposive after surgery"- 2017 , aspirated after surgery and had pneumonia- 2017    Depression    Diabetes mellitus without complication ([place])    "on the borderline"   Diverticulitis    Family history of adverse reaction to anesthesia    mother slow to wake up afgter 5 days, sister had port placed and had issues - went into coma but was very sick    GERD (gastroesophageal reflux disease)    Headache    Pneumonia    PONV (postoperative nausea and vomiting)    Prolapsed uterus    and bladder    Past Surgical History:  Procedure Laterality Date   BREAST LUMPECTOMY WITH RADIOACTIVE SEED AND SENTINEL LYMPH NODE BIOPSY Right  10/19/2017   Procedure: INJECT BLUE DYE RIGHT BREAST AND RIGHT BREAST LUMPECTOMY WITH TWO RADIOACTIVE SEEDS AND RIGHT AXILLARY SENTINEL LYMPH NODE BIOPSY;  Surgeon: [name], MD;  Location: [facility];  Service: General;  Laterality: Right;   COLON SURGERY     HERNIA REPAIR     PORTACATH PLACEMENT Left 12/07/2017   Procedure: INSERTION PORT-A-CATH WITH ULTRA SOUND;  Surgeon: [name], MD;  Location: [facility];  Service: General;  Laterality: Left;    Family History  Problem Relation Age of Onset   Osteoporosis Mother    Heart disease Father     Social History:  reports that she has never smoked. She has never used smokeless tobacco. She reports that she does not drink alcohol and does not use drugs.The patient is alone  today.  Allergies:  Allergies  Allergen Reactions   Codeine Rash   Hydrocodone Rash    Current Medications: Current Outpatient Medications  Medication Sig Dispense Refill   anastrozole (ARIMIDEX) 1 MG tablet Take 1 mg by mouth daily.     Calcium-Vitamin D-Vitamin K 650-12.5-40 MG-MCG-MCG CHEW Chew 1,300 mg by mouth daily.     ondansetron (ZOFRAN) 4 MG tablet Take 1 tablet by mouth every 4 (four) hours as needed.  2   pantoprazole (PROTONIX) 20 MG tablet Take 20 mg by mouth daily.     polyethylene glycol (MIRALAX / GLYCOLAX) 17 g packet Take 17 g by mouth daily.     rizatriptan (MAXALT) 10 MG tablet Take 5-10 mg by mouth 2 (two) times daily as needed for migraine. May repeat in 2  hours if needed     simvastatin (ZOCOR) 40 MG tablet Take 40 mg by mouth daily.     traZODone (DESYREL) 50 MG tablet Take 1 tablet (50 mg total) by mouth at bedtime. 30 tablet 3   venlafaxine XR (EFFEXOR-XR) 75 MG 24 hr capsule Take 75 mg by mouth daily with breakfast.     No current facility-administered medications for this visit.     ASSESSMENT & PLAN:   Assessment:  Sheena Joyce is a 74 y.o. female with  1. Stage IIIB breast cancer.  She remains without  evidence of recurrence.  She will continue anastrozole for least a total of 5 years, as long as we can treat her osteoporosis.   2. History of subsegmental pulmonary embolism in October 2019.  She treated with enoxaparin, then rivaroxaban.  Anticoagulation was discontinued in February 2020. She does not have symptoms suggestive of recurrence. 3. Osteoporosis, for which she is receiving Prolia injections every 6 months as of May 2020.  She had significant bone pain associated with this, but started Claritin and did not have as much bone pain with her next injection.  She will be due for bone density in February 2022.  Plan: She continues to do well.  I will send in a 90 day supply of anastrozole as requested.  We will plan to see her back in 4 months with a CBC and comprehensive metabolic panel prior to her next Prolia.  She will be due for her repeat bone density scan next year. 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 I gave her a pamphlet for [facility] and a prescription for a cosmetic prosthesis. I will order her bone density exam for February. She will continue with her next Prolia scheduled for Thursday.   The patient understands the plans discussed today and is in agreement with them.    The patient knows to contact our office if her develops concerns prior to her next appointment.   I provided 20 minutes  of face-to-face time during this this encounter and > 50% was spent counseling as documented under my assessment and plan.    [name], NP

## 2020-01-29 ENCOUNTER — Other Ambulatory Visit: Payer: Self-pay

## 2020-01-29 ENCOUNTER — Other Ambulatory Visit: Payer: Self-pay | Admitting: Hematology and Oncology

## 2020-01-29 ENCOUNTER — Inpatient Hospital Stay (INDEPENDENT_AMBULATORY_CARE_PROVIDER_SITE_OTHER): Payer: PPO | Admitting: Hematology and Oncology

## 2020-01-29 ENCOUNTER — Telehealth: Payer: Self-pay | Admitting: Oncology

## 2020-01-29 ENCOUNTER — Inpatient Hospital Stay: Payer: PPO | Attending: Hematology and Oncology

## 2020-01-29 DIAGNOSIS — Z0001 Encounter for general adult medical examination with abnormal findings: Secondary | ICD-10-CM | POA: Diagnosis not present

## 2020-01-29 DIAGNOSIS — Z9221 Personal history of antineoplastic chemotherapy: Secondary | ICD-10-CM | POA: Diagnosis not present

## 2020-01-29 DIAGNOSIS — C50811 Malignant neoplasm of overlapping sites of right female breast: Secondary | ICD-10-CM

## 2020-01-29 DIAGNOSIS — Z170 Estrogen receptor positive status [ER+]: Secondary | ICD-10-CM

## 2020-01-29 DIAGNOSIS — M810 Age-related osteoporosis without current pathological fracture: Secondary | ICD-10-CM | POA: Diagnosis not present

## 2020-01-29 DIAGNOSIS — C50919 Malignant neoplasm of unspecified site of unspecified female breast: Secondary | ICD-10-CM | POA: Diagnosis not present

## 2020-01-29 DIAGNOSIS — Z86711 Personal history of pulmonary embolism: Secondary | ICD-10-CM | POA: Diagnosis not present

## 2020-01-29 DIAGNOSIS — D649 Anemia, unspecified: Secondary | ICD-10-CM | POA: Diagnosis not present

## 2020-01-29 DIAGNOSIS — Z79811 Long term (current) use of aromatase inhibitors: Secondary | ICD-10-CM

## 2020-01-29 LAB — BASIC METABOLIC PANEL
BUN: 13 (ref 4–21)
CO2: 28 — AB (ref 13–22)
Chloride: 105 (ref 99–108)
Creatinine: 0.9 (ref 0.5–1.1)
Glucose: 114
Potassium: 4 (ref 3.4–5.3)
Sodium: 139 (ref 137–147)

## 2020-01-29 LAB — CBC
MCV: 94 (ref 76–111)
RBC: 4.4 (ref 3.87–5.11)

## 2020-01-29 LAB — CBC AND DIFFERENTIAL
HCT: 41 (ref 36–46)
Hemoglobin: 13.5 (ref 12.0–16.0)
Neutrophils Absolute: 3.84
Platelets: 257 (ref 150–399)
WBC: 6.4

## 2020-01-29 LAB — HEPATIC FUNCTION PANEL
ALT: 18 (ref 7–35)
AST: 24 (ref 13–35)
Alkaline Phosphatase: 62 (ref 25–125)
Bilirubin, Total: 0.3

## 2020-01-29 LAB — COMPREHENSIVE METABOLIC PANEL
Albumin: 4 (ref 3.5–5.0)
Calcium: 9.2 (ref 8.7–10.7)

## 2020-01-29 MED ORDER — TRAZODONE HCL 50 MG PO TABS: 50.0000 mg | ORAL_TABLET | Freq: Every day | ORAL | 3 refills | Status: DC

## 2020-01-29 NOTE — Telephone Encounter (Signed)
Per 11/30 LOS, Patient scheduled for June 2022 Appts.  Gave patient Appt Summary

## 2020-01-31 ENCOUNTER — Inpatient Hospital Stay: Payer: PPO | Attending: Hematology and Oncology

## 2020-01-31 ENCOUNTER — Other Ambulatory Visit: Payer: Self-pay

## 2020-01-31 VITALS — BP 125/76 | HR 82 | Temp 98.1°F | Resp 18 | Ht 59.0 in | Wt 172.5 lb

## 2020-01-31 DIAGNOSIS — C50111 Malignant neoplasm of central portion of right female breast: Secondary | ICD-10-CM | POA: Insufficient documentation

## 2020-01-31 DIAGNOSIS — M810 Age-related osteoporosis without current pathological fracture: Secondary | ICD-10-CM | POA: Insufficient documentation

## 2020-01-31 MED ORDER — DIPHENHYDRAMINE HCL 50 MG/ML IJ SOLN: 25.0000 mg | Freq: Once | INTRAMUSCULAR | Status: DC | PRN

## 2020-01-31 MED ORDER — METHYLPREDNISOLONE SODIUM SUCC 125 MG IJ SOLR: 125.0000 mg | Freq: Once | INTRAMUSCULAR | Status: DC | PRN

## 2020-01-31 MED ORDER — DENOSUMAB 60 MG/ML ~~LOC~~ SOSY
60.0000 mg | PREFILLED_SYRINGE | Freq: Once | SUBCUTANEOUS | Status: AC
  Administered 2020-01-31: 60 mg via SUBCUTANEOUS

## 2020-01-31 MED ORDER — SODIUM CHLORIDE 0.9 % IV SOLN
Freq: Once | INTRAVENOUS | Status: DC | PRN
  Filled 2020-01-31: qty 250

## 2020-01-31 MED ORDER — DIPHENHYDRAMINE HCL 50 MG/ML IJ SOLN: 50.0000 mg | Freq: Once | INTRAMUSCULAR | Status: DC | PRN

## 2020-01-31 MED ORDER — ALBUTEROL SULFATE (2.5 MG/3ML) 0.083% IN NEBU
2.5000 mg | INHALATION_SOLUTION | Freq: Once | RESPIRATORY_TRACT | Status: DC | PRN
  Filled 2020-01-31: qty 3

## 2020-01-31 MED ORDER — SODIUM CHLORIDE 0.9 % IV SOLN
Freq: Once | INTRAVENOUS | Status: DC
  Filled 2020-01-31: qty 250

## 2020-01-31 MED ORDER — EPINEPHRINE 1 MG/10ML IJ SOSY
0.2500 mg | PREFILLED_SYRINGE | Freq: Once | INTRAMUSCULAR | Status: DC | PRN
  Filled 2020-01-31: qty 10

## 2020-01-31 MED ORDER — DENOSUMAB 60 MG/ML ~~LOC~~ SOSY
PREFILLED_SYRINGE | SUBCUTANEOUS | Status: AC
  Filled 2020-01-31: qty 1

## 2020-01-31 NOTE — Progress Notes (Signed)
PT STABLE AT TIME OF DISCHARGE 

## 2020-01-31 NOTE — Patient Instructions (Signed)
Denosumab injection What is this medicine? DENOSUMAB (den oh sue mab) slows bone breakdown. Prolia is used to treat osteoporosis in women after menopause and in men, and in people who are taking corticosteroids for 6 months or more. Xgeva is used to treat a high calcium level due to cancer and to prevent bone fractures and other bone problems caused by multiple myeloma or cancer bone metastases. Xgeva is also used to treat giant cell tumor of the bone. This medicine may be used for other purposes; ask your health care provider or pharmacist if you have questions. COMMON BRAND NAME(S): Prolia, XGEVA What should I tell my health care provider before I take this medicine? They need to know if you have any of these conditions:  dental disease  having surgery or tooth extraction  infection  kidney disease  low levels of calcium or Vitamin D in the blood  malnutrition  on hemodialysis  skin conditions or sensitivity  thyroid or parathyroid disease  an unusual reaction to denosumab, other medicines, foods, dyes, or preservatives  pregnant or trying to get pregnant  breast-feeding How should I use this medicine? This medicine is for injection under the skin. It is given by a health care professional in a hospital or clinic setting. A special MedGuide will be given to you before each treatment. Be sure to read this information carefully each time. For Prolia, talk to your pediatrician regarding the use of this medicine in children. Special care may be needed. For Xgeva, talk to your pediatrician regarding the use of this medicine in children. While this drug may be prescribed for children as young as 13 years for selected conditions, precautions do apply. Overdosage: If you think you have taken too much of this medicine contact a poison control center or emergency room at once. NOTE: This medicine is only for you. Do not share this medicine with others. What if I miss a dose? It is  important not to miss your dose. Call your doctor or health care professional if you are unable to keep an appointment. What may interact with this medicine? Do not take this medicine with any of the following medications:  other medicines containing denosumab This medicine may also interact with the following medications:  medicines that lower your chance of fighting infection  steroid medicines like prednisone or cortisone This list may not describe all possible interactions. Give your health care provider a list of all the medicines, herbs, non-prescription drugs, or dietary supplements you use. Also tell them if you smoke, drink alcohol, or use illegal drugs. Some items may interact with your medicine. What should I watch for while using this medicine? Visit your doctor or health care professional for regular checks on your progress. Your doctor or health care professional may order blood tests and other tests to see how you are doing. Call your doctor or health care professional for advice if you get a fever, chills or sore throat, or other symptoms of a cold or flu. Do not treat yourself. This drug may decrease your body's ability to fight infection. Try to avoid being around people who are sick. You should make sure you get enough calcium and vitamin D while you are taking this medicine, unless your doctor tells you not to. Discuss the foods you eat and the vitamins you take with your health care professional. See your dentist regularly. Brush and floss your teeth as directed. Before you have any dental work done, tell your dentist you are   receiving this medicine. Do not become pregnant while taking this medicine or for 5 months after stopping it. Talk with your doctor or health care professional about your birth control options while taking this medicine. Women should inform their doctor if they wish to become pregnant or think they might be pregnant. There is a potential for serious side  effects to an unborn child. Talk to your health care professional or pharmacist for more information. What side effects may I notice from receiving this medicine? Side effects that you should report to your doctor or health care professional as soon as possible:  allergic reactions like skin rash, itching or hives, swelling of the face, lips, or tongue  bone pain  breathing problems  dizziness  jaw pain, especially after dental work  redness, blistering, peeling of the skin  signs and symptoms of infection like fever or chills; cough; sore throat; pain or trouble passing urine  signs of low calcium like fast heartbeat, muscle cramps or muscle pain; pain, tingling, numbness in the hands or feet; seizures  unusual bleeding or bruising  unusually weak or tired Side effects that usually do not require medical attention (report to your doctor or health care professional if they continue or are bothersome):  constipation  diarrhea  headache  joint pain  loss of appetite  muscle pain  runny nose  tiredness  upset stomach This list may not describe all possible side effects. Call your doctor for medical advice about side effects. You may report side effects to FDA at 1-800-FDA-1088. Where should I keep my medicine? This medicine is only given in a clinic, doctor's office, or other health care setting and will not be stored at home. NOTE: This sheet is a summary. It may not cover all possible information. If you have questions about this medicine, talk to your doctor, pharmacist, or health care provider.  2020 Elsevier/Gold Standard (2017-06-24 16:10:44)

## 2020-02-03 DIAGNOSIS — J0100 Acute maxillary sinusitis, unspecified: Secondary | ICD-10-CM | POA: Diagnosis not present

## 2020-02-03 DIAGNOSIS — Z20828 Contact with and (suspected) exposure to other viral communicable diseases: Secondary | ICD-10-CM | POA: Diagnosis not present

## 2020-02-03 DIAGNOSIS — R0981 Nasal congestion: Secondary | ICD-10-CM | POA: Diagnosis not present

## 2020-02-03 DIAGNOSIS — R070 Pain in throat: Secondary | ICD-10-CM | POA: Diagnosis not present

## 2020-02-03 DIAGNOSIS — R051 Acute cough: Secondary | ICD-10-CM | POA: Diagnosis not present

## 2020-02-11 DIAGNOSIS — J40 Bronchitis, not specified as acute or chronic: Secondary | ICD-10-CM | POA: Diagnosis not present

## 2020-02-11 DIAGNOSIS — J329 Chronic sinusitis, unspecified: Secondary | ICD-10-CM | POA: Diagnosis not present

## 2020-02-11 DIAGNOSIS — Z20828 Contact with and (suspected) exposure to other viral communicable diseases: Secondary | ICD-10-CM | POA: Diagnosis not present

## 2020-04-22 DIAGNOSIS — J209 Acute bronchitis, unspecified: Secondary | ICD-10-CM | POA: Diagnosis not present

## 2020-05-23 DIAGNOSIS — Z6833 Body mass index (BMI) 33.0-33.9, adult: Secondary | ICD-10-CM | POA: Diagnosis not present

## 2020-05-23 DIAGNOSIS — I714 Abdominal aortic aneurysm, without rupture: Secondary | ICD-10-CM | POA: Diagnosis not present

## 2020-05-23 DIAGNOSIS — Z9181 History of falling: Secondary | ICD-10-CM | POA: Diagnosis not present

## 2020-05-23 DIAGNOSIS — E1129 Type 2 diabetes mellitus with other diabetic kidney complication: Secondary | ICD-10-CM | POA: Diagnosis not present

## 2020-05-23 DIAGNOSIS — Z2821 Immunization not carried out because of patient refusal: Secondary | ICD-10-CM | POA: Diagnosis not present

## 2020-05-23 DIAGNOSIS — M461 Sacroiliitis, not elsewhere classified: Secondary | ICD-10-CM | POA: Diagnosis not present

## 2020-05-23 DIAGNOSIS — C50911 Malignant neoplasm of unspecified site of right female breast: Secondary | ICD-10-CM | POA: Diagnosis not present

## 2020-05-23 DIAGNOSIS — E669 Obesity, unspecified: Secondary | ICD-10-CM | POA: Diagnosis not present

## 2020-05-23 DIAGNOSIS — F331 Major depressive disorder, recurrent, moderate: Secondary | ICD-10-CM | POA: Diagnosis not present

## 2020-05-23 DIAGNOSIS — M62830 Muscle spasm of back: Secondary | ICD-10-CM | POA: Diagnosis not present

## 2020-05-23 DIAGNOSIS — M7062 Trochanteric bursitis, left hip: Secondary | ICD-10-CM | POA: Diagnosis not present

## 2020-05-23 DIAGNOSIS — M7061 Trochanteric bursitis, right hip: Secondary | ICD-10-CM | POA: Diagnosis not present

## 2020-06-14 DIAGNOSIS — R197 Diarrhea, unspecified: Secondary | ICD-10-CM | POA: Diagnosis not present

## 2020-06-14 DIAGNOSIS — R1084 Generalized abdominal pain: Secondary | ICD-10-CM | POA: Diagnosis not present

## 2020-06-14 DIAGNOSIS — K591 Functional diarrhea: Secondary | ICD-10-CM | POA: Diagnosis not present

## 2020-06-20 DIAGNOSIS — E669 Obesity, unspecified: Secondary | ICD-10-CM | POA: Diagnosis not present

## 2020-06-20 DIAGNOSIS — K580 Irritable bowel syndrome with diarrhea: Secondary | ICD-10-CM | POA: Diagnosis not present

## 2020-06-20 DIAGNOSIS — Z6832 Body mass index (BMI) 32.0-32.9, adult: Secondary | ICD-10-CM | POA: Diagnosis not present

## 2020-06-20 DIAGNOSIS — R195 Other fecal abnormalities: Secondary | ICD-10-CM | POA: Diagnosis not present

## 2020-07-16 ENCOUNTER — Other Ambulatory Visit: Payer: Self-pay | Admitting: Hematology and Oncology

## 2020-07-16 DIAGNOSIS — F32A Depression, unspecified: Secondary | ICD-10-CM

## 2020-07-17 ENCOUNTER — Other Ambulatory Visit: Payer: Self-pay | Admitting: Oncology

## 2020-07-21 ENCOUNTER — Encounter: Payer: Self-pay | Admitting: Oncology

## 2020-07-24 ENCOUNTER — Other Ambulatory Visit: Payer: Self-pay | Admitting: Hematology and Oncology

## 2020-07-24 DIAGNOSIS — M810 Age-related osteoporosis without current pathological fracture: Secondary | ICD-10-CM

## 2020-07-30 ENCOUNTER — Inpatient Hospital Stay: Payer: PPO | Admitting: Hematology and Oncology

## 2020-07-30 ENCOUNTER — Encounter: Payer: Self-pay | Admitting: Oncology

## 2020-07-30 ENCOUNTER — Inpatient Hospital Stay: Payer: PPO

## 2020-07-31 ENCOUNTER — Inpatient Hospital Stay: Payer: PPO

## 2020-08-01 DIAGNOSIS — M8589 Other specified disorders of bone density and structure, multiple sites: Secondary | ICD-10-CM | POA: Diagnosis not present

## 2020-08-01 DIAGNOSIS — M810 Age-related osteoporosis without current pathological fracture: Secondary | ICD-10-CM | POA: Diagnosis not present

## 2020-08-04 ENCOUNTER — Ambulatory Visit: Payer: PPO | Admitting: Hematology and Oncology

## 2020-08-04 ENCOUNTER — Encounter: Payer: Self-pay | Admitting: Hematology and Oncology

## 2020-08-04 ENCOUNTER — Inpatient Hospital Stay: Payer: PPO | Admitting: Hematology and Oncology

## 2020-08-04 ENCOUNTER — Telehealth: Payer: Self-pay | Admitting: Oncology

## 2020-08-04 ENCOUNTER — Other Ambulatory Visit: Payer: PPO

## 2020-08-04 ENCOUNTER — Other Ambulatory Visit: Payer: Self-pay

## 2020-08-04 ENCOUNTER — Inpatient Hospital Stay: Payer: PPO | Attending: Oncology

## 2020-08-04 VITALS — BP 150/64 | HR 86 | Temp 98.2°F | Resp 18 | Ht 59.0 in | Wt 168.3 lb

## 2020-08-04 DIAGNOSIS — M810 Age-related osteoporosis without current pathological fracture: Secondary | ICD-10-CM | POA: Insufficient documentation

## 2020-08-04 DIAGNOSIS — D649 Anemia, unspecified: Secondary | ICD-10-CM | POA: Diagnosis not present

## 2020-08-04 DIAGNOSIS — Z170 Estrogen receptor positive status [ER+]: Secondary | ICD-10-CM

## 2020-08-04 DIAGNOSIS — C50811 Malignant neoplasm of overlapping sites of right female breast: Secondary | ICD-10-CM

## 2020-08-04 DIAGNOSIS — Z79899 Other long term (current) drug therapy: Secondary | ICD-10-CM | POA: Insufficient documentation

## 2020-08-04 DIAGNOSIS — C50911 Malignant neoplasm of unspecified site of right female breast: Secondary | ICD-10-CM | POA: Insufficient documentation

## 2020-08-04 LAB — CBC AND DIFFERENTIAL
HCT: 43 (ref 36–46)
Hemoglobin: 14.1 (ref 12.0–16.0)
Neutrophils Absolute: 7.17
Platelets: 272 (ref 150–399)
WBC: 10.1

## 2020-08-04 LAB — BASIC METABOLIC PANEL
BUN: 17 (ref 4–21)
CO2: 25 — AB (ref 13–22)
Chloride: 101 (ref 99–108)
Creatinine: 0.8 (ref 0.5–1.1)
Glucose: 92
Potassium: 4.2 (ref 3.4–5.3)
Sodium: 135 — AB (ref 137–147)

## 2020-08-04 LAB — CBC: RBC: 4.62 (ref 3.87–5.11)

## 2020-08-04 LAB — HEPATIC FUNCTION PANEL
ALT: 19 (ref 7–35)
AST: 27 (ref 13–35)
Alkaline Phosphatase: 74 (ref 25–125)
Bilirubin, Total: 0.5

## 2020-08-04 LAB — COMPREHENSIVE METABOLIC PANEL
Albumin: 4.6 (ref 3.5–5.0)
Calcium: 9.4 (ref 8.7–10.7)

## 2020-08-04 NOTE — Telephone Encounter (Signed)
Per 6/6 LOS, patient scheduled for Dec Appt's.  Gave patient Appt Summary

## 2020-08-04 NOTE — Progress Notes (Signed)
Sheena Joyce  [address] [place],  [place]  [address] [phone]  Clinic Day:  08/08/2020  Referring physician: Emmaline Kluver, *   CHIEF COMPLAINT:  CC: A 75 year old woman with stage IIB (T2 N74mc M0) hormone positive breast cancer diagnosed in July 2019 here for 4 month evaluation  Current Treatment:  Anastrazole   HISTORY OF PRESENT ILLNESS:  Sheena OSBURNis a 75y.o. female with a history of stage IIB (T2 N132m M0) hormone positive breast cancer diagnosed in July 2019.  Her history dates back to May 2019 when a breast cancer was discovered on a screening mammogram, which revealed a possible mass in the right breast.  Diagnostic right mammogram revealed a mass in the superior central right breast, which measure 1.3 cm on ultrasound.  There was also a second similar mass measuring 1.1 cm, as well as borderline cortical thickening of a single right axilla.  Biopsies of both lesions were done.  The first revealed a grade 1, invasive lobular carcinoma along with lobular carcinoma in situ and the other biopsy site revealed a 5 mm focus of proliferative fibrocystic changes.  Estrogen and progesterone receptors were highly positive and HER 2 negative.  Ki 67 was 1%.  MRI  breast confirmed multifocal disease with adjacent enhancing masses in the upper inner quadrant of the right breast.  No other malignancy was found in either breast and no pathologic lymphadenopathy was seen.  She was treated with right breast lumpectomy in August 2019.  Pathology revealed a 4.5 cm, grade 2, invasive lobular carcinoma with lobular carcinoma in situ.  Two lymph nodes had micrometastases and 2 lymph nodes were negative for metastasis.  Margins were clear.  No lymphovascular or perineural invasion was seen.  EndoPredict testing revealed a high risk recurrence score.  Due to her personal and family history of malignancy, she underwent testing for hereditary cancer syndromes with  the [employer] myRisk hereditary cancer panel test.  This did not reveal any clinically significant mutation or variant of uncertain significance.  She was recommended for adjuvant chemotherapy with docetaxel/cyclophosphamide due to her high risk EndoPredict score.  She had difficulty tolerating treatment due to severe bone pain and headaches.  She then developed a subsegmental right lower lobe pulmonary embolism in October 2019.  She was initially treated with enoxaparin 1 milligram/kilogram twice daily for 1 month, then 1.5 milligrams/kilogram daily.  She was switched to rivaroxaban in December.  She completed 4 cycles of docetaxel/cyclophosphamide in December 2019. Due to toxicities, the chemotherapy doses were decreased by 20% with her 2nd cycle.  She required IV fluid support and pain medication throughout chemotherapy.   Bone density scan in February 2020 revealed osteoporosis, with a T-score of -2.2 of the spine for osteopenia and -2.6 for the right femur.  We really do not have a comparison study since 2012 and she had osteoporosis at that time, so this not a new problem, but has gone untreated.  She did not tolerate alendronate previously.  She is on calcium with vitamin-D 1500 mg daily.  She was started on anastrozole 1 mg daily at the end of January 2020.  Since completion of chemotherapy, the patient has complained of cough.  We recommended treatment of the osteoporosis with Prolia and she started that in May 2020 and had an increase in bone pain.  She continued to complain of bone pain in August.  She was seen in November 2020 prior to 2nd dose of  Prolia, at which time she reported back pain.  MRI through her orthopedic office apparently revealed degenerative disc changes.  She also reported chronic nonproductive cough.  We recommended she continue pantoprazole 40 mg daily, as we felt acid reflux could be contributing to her cough.  She had insomnia, despite lorazepam at bedtime.  She was given trazodone  50 mg 1-2 at bedtime as needed, but was not taking it regularly.  She reported a cough for 2 months when we saw her in February, which finally resolved after a 2nd course of antibiotics and Nasonex.   Annual bilateral mammogram and left breast ultrasound from July was clear.   INTERVAL HISTORY:  Sheena Joyce is here for 4 month evaluation. She has been well since last visit. She denies fever, chills, nausea or vomiting. She denies shortness of breath, chest pain or cough. She denies issue with bowel or bladder. She is tolerating anastrazole well. She continues with prolia injections and her most recent Dexa scan was this month. REVIEW OF SYSTEMS:  Review of Systems  Constitutional:  Negative for appetite change, chills, diaphoresis, fatigue, fever and unexpected weight change.  HENT:   Negative for hearing loss, lump/mass, mouth sores, nosebleeds, sore throat, tinnitus, trouble swallowing and voice change.   Eyes:  Negative for eye problems and icterus.  Respiratory:  Negative for chest tightness, cough, hemoptysis, shortness of breath and wheezing.   Cardiovascular:  Negative for chest pain, leg swelling and palpitations.  Gastrointestinal:  Negative for abdominal distention, abdominal pain, blood in stool, constipation, diarrhea, nausea, rectal pain and vomiting.  Endocrine: Negative for hot flashes.  Genitourinary:  Negative for bladder incontinence, difficulty urinating, dyspareunia, dysuria, frequency, hematuria and nocturia.   Musculoskeletal:  Negative for arthralgias, back pain, flank pain, gait problem, myalgias, neck pain and neck stiffness.  Skin:  Negative for itching, rash and wound.  Neurological:  Negative for dizziness, extremity weakness, gait problem, headaches, light-headedness, numbness, seizures and speech difficulty.  Hematological:  Negative for adenopathy. Does not bruise/bleed easily.  Psychiatric/Behavioral:  Negative for confusion, decreased concentration, depression, sleep  disturbance and suicidal ideas. The patient is not nervous/anxious.     VITALS:  Blood pressure (!) 150/64, pulse 86, temperature 98.2 F (36.8 C), temperature source Oral, resp. rate 18, height _0  (1.499 m), weight 168 lb 4.8 oz (76.3 kg), SpO2 93 %.  Wt Readings from Last 3 Encounters:  08/04/20 168 lb 4.8 oz (76.3 kg)  01/31/20 172 lb 8 oz (78.2 kg)  01/29/20 173 lb 1.6 oz (78.5 kg)    Body mass index is 33.99 kg/m.  Performance status (ECOG): 1 - Symptomatic but completely ambulatory  PHYSICAL EXAM:  Physical Exam Constitutional:      General: She is not in acute distress.    Appearance: Normal appearance. She is normal weight. She is not ill-appearing, toxic-appearing or diaphoretic.  HENT:     Head: Normocephalic and atraumatic.     Right Ear: Tympanic membrane, ear canal and external ear normal. There is no impacted cerumen.     Left Ear: Tympanic membrane, ear canal and external ear normal. There is no impacted cerumen.     Nose: Nose normal. No congestion or rhinorrhea.     Mouth/Throat:     Mouth: Mucous membranes are moist.     Pharynx: No oropharyngeal exudate or posterior oropharyngeal erythema.  Eyes:     General: No scleral icterus.       Right eye: No discharge.  Left eye: No discharge.     Extraocular Movements: Extraocular movements intact.     Conjunctiva/sclera: Conjunctivae normal.     Pupils: Pupils are equal, round, and reactive to light.  Neck:     Vascular: No carotid bruit.  Cardiovascular:     Rate and Rhythm: Normal rate and regular rhythm.     Pulses: Normal pulses.     Heart sounds: No murmur heard.   No friction rub. No gallop.  Pulmonary:     Effort: Pulmonary effort is normal. No respiratory distress.     Breath sounds: Normal breath sounds. No stridor. No wheezing, rhonchi or rales.  Chest:     Chest wall: No mass, lacerations, deformity, swelling, tenderness, crepitus or edema. There is no dullness to percussion.  Breasts:     Breasts are symmetrical.     Right: Normal. No swelling, bleeding, inverted nipple, mass, nipple discharge, skin change, tenderness, axillary adenopathy or supraclavicular adenopathy.     Left: Normal. No swelling, bleeding, inverted nipple, mass, nipple discharge, skin change, tenderness, axillary adenopathy or supraclavicular adenopathy.  Abdominal:     General: Abdomen is flat. Bowel sounds are normal. There is no distension.     Palpations: Abdomen is soft. There is no mass.     Tenderness: There is no abdominal tenderness. There is no right CVA tenderness, left CVA tenderness, guarding or rebound.     Hernia: No hernia is present.  Musculoskeletal:        General: No swelling, tenderness, deformity or signs of injury. Normal range of motion.     Cervical back: Normal range of motion and neck supple. No rigidity or tenderness.     Right lower leg: No edema.     Left lower leg: No edema.  Lymphadenopathy:     Cervical: No cervical adenopathy.     Upper Body:     Right upper body: No supraclavicular, axillary or pectoral adenopathy.     Left upper body: No supraclavicular, axillary or pectoral adenopathy.  Skin:    General: Skin is warm and dry.     Capillary Refill: Capillary refill takes less than 2 seconds.     Coloration: Skin is not jaundiced or pale.     Findings: No bruising, erythema, lesion or rash.  Neurological:     General: No focal deficit present.     Mental Status: She is alert and oriented to person, place, and time. Mental status is at baseline.     Cranial Nerves: No cranial nerve deficit.     Sensory: No sensory deficit.     Motor: No weakness.     Coordination: Coordination normal.     Gait: Gait normal.     Deep Tendon Reflexes: Reflexes normal.  Psychiatric:        Mood and Affect: Mood normal.        Behavior: Behavior normal.        Thought Content: Thought content normal.        Judgment: Judgment normal.   Lymph nodes:   There is no cervical,  clavicular, axillary or inguinal lymphadenopathy.   LABS:   CBC Latest Ref Rng & Units 08/04/2020 01/29/2020 12/07/2017  WBC - 10.1 6.4 5.3  Hemoglobin 12.0 - 16.0 14.1 13.5 12.8  Hematocrit 36 - 46 43 41 40.6  Platelets 150 - 399 272 257 296   CMP Latest Ref Rng & Units 08/04/2020 01/29/2020 12/07/2017  Glucose 70 - 99 mg/dL - - 115(H)  BUN  4 - _0 Creatinine 0.5 - 1.1 0.8 0.9 0.89  Sodium 137 - 147 135(A) 139 138  Potassium 3.4 - 5.3 4.2 4.0 3.8  Chloride 99 - 108 101 105 106  CO2 13 - 22 25(A) 28(A) 24  Calcium 8.7 - 10.7 9.4 9.2 8.8(L)  Total Protein 6.5 - 8.1 g/dL - - -  Total Bilirubin 0.3 - 1.2 mg/dL - - -  Alkaline Phos 25 - 125 74 62 -  AST 13 - 35 27 24 -  ALT 7 - 35 19 18 -     No results found for: CEA1 / No results found for: CEA1 No results found for: PSA1 No results found for: FBX038 No results found for: BFX832  No results found for: TOTALPROTELP, ALBUMINELP, A1GS, A2GS, BETS, BETA2SER, GAMS, MSPIKE, SPEI No results found for: TIBC, FERRITIN, IRONPCTSAT No results found for: LDH  STUDIES:  Exam(s): [id number] DEXA/DG DEXA EXAM: DUAL X-RAY ABSORPTIOMETRY (DXA) FOR BONE MINERAL DENSITY  IMPRESSION: [place] completed a BMD test on 08/01/2020 using the Lunar iDXA DXA System (analysis version: 13.60) manufactured by [employer]. The following summarizes the results of our evaluation. Technologist: [place]  PATIENT BIOGRAPHICAL: Name: Sheena, Joyce Patient ID:  [id number] [place] Birth Date: September 19, 1945 Height:     60.0 in. Gender: Female Exam Date: 08/01/2020 Weight: 171.0 lbs. Indications:  m85.89      Fractures:            Treatments:  ASSESSMENT: The BMD measured at Femur Neck Left is 0.670 g/cm2 with a T-score of -2.6. This patient is considered osteoporotic according to [facility]) criteria. The scan quality is good. Patient currently takes a bone building therapy- Prolia  Site Region Measured Measured WHO Young Adult BMD Date      Age      Classification T-score AP Spine L1-L4 7/01/2020 76.2 Osteopenia -1.4 1.013 g/cm2 AP Spine L1-L4 04/03/2018 73.8 Osteopenia -2.2 0.913 g/cm2  DualFemur Neck Left 08/01/2020 76.2 Osteoporosis -2.6 0.670 g/cm2 DualFemur Neck Left 04/03/2018 73.8 Osteoporosis -2.5 0.686 g/cm2  DualFemur Total Mean 08/01/2020 76.2 Osteopenia -2.0 0.756 g/cm2 DualFemur Total Mean 04/03/2018 73.8 Osteopenia -2.0 0.752 g/cm2  World Health Organization [facility]) criteria for post-menopausal, Caucasian Women: Normal:       T-score at or above -1 SD Osteopenia:   T-score between -1 and -2.5 SD Osteoporosis: T-score at or below -2.5 SD  RECOMMENDATIONS: 1. All patients should optimize calcium and vitamin D intake. 2. Consider FDA- approved medical therapies in post menopausal women and men aged 87 years and older, based on the following: a. A hip or vertebral (clinical or morphometric) fracture. b. T- score < 2.5 of the femoral neck or spine after appropriate evaluation to exclude secondary causes. c. Low bone mass (T- score between -1.0 and -2.5 at femoral neck or spine) and a 10 -year probability of a hip fracture > 3% or a 10 -year probability of a major osteoporosis- related fracture > 20% based on the [place]- adapted WHO algorithm. d. Clinician judgement and/ or patient preferences may indicate treatment for people with 10- year fracture probabilities above or below these levels.  FOLLOW-UP: People with diagnosed cases of osteoporosis or at high risk for fracture should have regular bone mineral density tests. For patients eligible for Medicare, routine testing is allowed once every 2 years. The testing frequency can be increased to one year for patients who have rapidly progressing disease, those who are receiving medical therapy to restore  bone mass, or have additional risk factors. I have reviewed this report and agree with the above findings.  Mark A. Thornton Papas, M.D.  [facility]  Radiology   Electronically Signed   By: Lavonia Dana M.D.   On: 08/01/2020 12:39  Electronically Signed By: Burnetta Sabin MD  Electronically Signed Date/Time: 06/03/221241 Dictate Date/Time: 08/01/20 1238  Technologist: Dannette Barbara J Transcribed By: Odie Sera Transcribed Date/Time: 08/01/20 1239   HISTORY:   Past Medical History:  Diagnosis Date   Anxiety    Arthritis    Cancer of overlapping sites of right female breast ([place]) 5/00/9381   Complication of anesthesia    "became unresposive after surgery"- 2017 , aspirated after surgery and had pneumonia- 2017    Depression    Diabetes mellitus without complication ([place])    "on the borderline"   Diverticulitis    Family history of adverse reaction to anesthesia    mother slow to wake up afgter 5 days, sister had port placed and had issues - went into coma but was very sick    GERD (gastroesophageal reflux disease)    Headache    Pneumonia    PONV (postoperative nausea and vomiting)    Prolapsed uterus    and bladder    Past Surgical History:  Procedure Laterality Date   BREAST LUMPECTOMY WITH RADIOACTIVE SEED AND SENTINEL LYMPH NODE BIOPSY Right 10/19/2017   Procedure: INJECT BLUE DYE RIGHT BREAST AND RIGHT BREAST LUMPECTOMY WITH TWO RADIOACTIVE SEEDS AND RIGHT AXILLARY SENTINEL LYMPH NODE BIOPSY;  Surgeon: [name], MD;  Location: [facility];  Service: General;  Laterality: Right;   COLON SURGERY     HERNIA REPAIR     PORTACATH PLACEMENT Left 12/07/2017   Procedure: INSERTION PORT-A-CATH WITH ULTRA SOUND;  Surgeon: [name], MD;  Location: [facility];  Service: General;  Laterality: Left;    Family History  Problem Relation Age of Onset   Osteoporosis Mother    Heart disease Father     Social History:  reports that she has never smoked. She has never used smokeless tobacco. She reports that she does not drink alcohol and does not use drugs.The patient is alone  today.  Allergies:  Allergies  Allergen Reactions    Other     Other reaction(s): Other (See Comments) Pt states medication after sx made her "unresponsive", "sounds like oxycodone but can't remember the name"   Codeine Rash   Hydrocodone Rash    Current Medications: Current Outpatient Medications  Medication Sig Dispense Refill   anastrozole (ARIMIDEX) 1 MG tablet TAKE 1 TABLET BY MOUTH ONCE DAILY 90 tablet 3   Calcium-Vitamin D-Vitamin K 650-12.5-40 MG-MCG-MCG CHEW Chew 1,300 mg by mouth daily.     dicyclomine (BENTYL) 20 MG tablet Take 20 mg by mouth 4 (four) times daily.     meloxicam (MOBIC) 15 MG tablet Take 1 tablet by mouth daily.     Multiple Vitamin (MULTI-VITAMIN) tablet Take 2 tablets by mouth daily.     ondansetron (ZOFRAN) 4 MG tablet Take 1 tablet by mouth every 4 (four) hours as needed.  2   pantoprazole (PROTONIX) 40 MG tablet Take 1 tablet by mouth daily.     polyethylene glycol (MIRALAX / GLYCOLAX) 17 g packet Take 17 g by mouth daily.     promethazine (PHENERGAN) 12.5 MG tablet Take by mouth.     rizatriptan (MAXALT) 10 MG tablet Take 5-10 mg by mouth 2 (two) times daily as needed for migraine. May repeat in 2 hours  if needed     simvastatin (ZOCOR) 40 MG tablet Take 40 mg by mouth daily.     tiZANidine (ZANAFLEX) 4 MG capsule Take 4 mg by mouth 3 (three) times daily.     traZODone (DESYREL) 50 MG tablet TAKE 1 TABLET BY MOUTH AT BEDTIME 90 tablet 2   venlafaxine XR (EFFEXOR-XR) 75 MG 24 hr capsule Take 75 mg by mouth daily with breakfast.     No current facility-administered medications for this visit.     ASSESSMENT & PLAN:   Assessment:  Sheena Joyce is a 75 y.o. female with  1. Stage IIIB breast cancer.  She remains without evidence of recurrence.  She will continue anastrozole for least a total of 5 years, as long as we can treat her osteoporosis.   2. History of subsegmental pulmonary embolism in October 2019.  She treated with enoxaparin, then rivaroxaban.  Anticoagulation was discontinued in February 2020.  She does not have symptoms suggestive of recurrence. 3. Osteoporosis, for which she is receiving Prolia injections every 6 months as of May 2020.  She had significant bone pain associated with this, but started Claritin and did not have as much bone pain with her next injection.    Plan: She continues to do well. She will continue with anastrozole and prolia. We discussed her bone density results and I provided her with a chart describing the differences between osteopenia and osteoporosis as well as the values for each. We will see her back in clinic in 6 months for repeat evaluation.   She verbalizes understanding of and agreement to the plans discussed today. She knows to call the office should any new questions or concerns arise.       [name], NP

## 2020-08-06 ENCOUNTER — Inpatient Hospital Stay: Payer: PPO

## 2020-08-06 ENCOUNTER — Other Ambulatory Visit: Payer: Self-pay

## 2020-08-06 DIAGNOSIS — M810 Age-related osteoporosis without current pathological fracture: Secondary | ICD-10-CM | POA: Diagnosis not present

## 2020-08-06 DIAGNOSIS — C50911 Malignant neoplasm of unspecified site of right female breast: Secondary | ICD-10-CM | POA: Diagnosis not present

## 2020-08-06 DIAGNOSIS — Z79899 Other long term (current) drug therapy: Secondary | ICD-10-CM | POA: Diagnosis not present

## 2020-08-06 MED ORDER — DENOSUMAB 60 MG/ML ~~LOC~~ SOSY
PREFILLED_SYRINGE | SUBCUTANEOUS | Status: AC
  Filled 2020-08-06: qty 1

## 2020-08-06 MED ORDER — SODIUM CHLORIDE 0.9 % IV SOLN
Freq: Once | INTRAVENOUS | Status: DC
  Filled 2020-08-06: qty 250

## 2020-08-06 MED ORDER — DENOSUMAB 60 MG/ML ~~LOC~~ SOSY
60.0000 mg | PREFILLED_SYRINGE | Freq: Once | SUBCUTANEOUS | Status: AC
  Administered 2020-08-06: 60 mg via SUBCUTANEOUS

## 2020-08-06 NOTE — Patient Instructions (Signed)
Denosumab injection What is this medicine? DENOSUMAB (den oh sue mab) slows bone breakdown. Prolia is used to treat osteoporosis in women after menopause and in men, and in people who are taking corticosteroids for 6 months or more. Xgeva is used to treat a high calcium level due to cancer and to prevent bone fractures and other bone problems caused by multiple myeloma or cancer bone metastases. Xgeva is also used to treat giant cell tumor of the bone. This medicine may be used for other purposes; ask your health care provider or pharmacist if you have questions. COMMON BRAND NAME(S): Prolia, XGEVA What should I tell my health care provider before I take this medicine? They need to know if you have any of these conditions:  dental disease  having surgery or tooth extraction  infection  kidney disease  low levels of calcium or Vitamin D in the blood  malnutrition  on hemodialysis  skin conditions or sensitivity  thyroid or parathyroid disease  an unusual reaction to denosumab, other medicines, foods, dyes, or preservatives  pregnant or trying to get pregnant  breast-feeding How should I use this medicine? This medicine is for injection under the skin. It is given by a health care professional in a hospital or clinic setting. A special MedGuide will be given to you before each treatment. Be sure to read this information carefully each time. For Prolia, talk to your pediatrician regarding the use of this medicine in children. Special care may be needed. For Xgeva, talk to your pediatrician regarding the use of this medicine in children. While this drug may be prescribed for children as young as 13 years for selected conditions, precautions do apply. Overdosage: If you think you have taken too much of this medicine contact a poison control center or emergency room at once. NOTE: This medicine is only for you. Do not share this medicine with others. What if I miss a dose? It is  important not to miss your dose. Call your doctor or health care professional if you are unable to keep an appointment. What may interact with this medicine? Do not take this medicine with any of the following medications:  other medicines containing denosumab This medicine may also interact with the following medications:  medicines that lower your chance of fighting infection  steroid medicines like prednisone or cortisone This list may not describe all possible interactions. Give your health care provider a list of all the medicines, herbs, non-prescription drugs, or dietary supplements you use. Also tell them if you smoke, drink alcohol, or use illegal drugs. Some items may interact with your medicine. What should I watch for while using this medicine? Visit your doctor or health care professional for regular checks on your progress. Your doctor or health care professional may order blood tests and other tests to see how you are doing. Call your doctor or health care professional for advice if you get a fever, chills or sore throat, or other symptoms of a cold or flu. Do not treat yourself. This drug may decrease your body's ability to fight infection. Try to avoid being around people who are sick. You should make sure you get enough calcium and vitamin D while you are taking this medicine, unless your doctor tells you not to. Discuss the foods you eat and the vitamins you take with your health care professional. See your dentist regularly. Brush and floss your teeth as directed. Before you have any dental work done, tell your dentist you are   receiving this medicine. Do not become pregnant while taking this medicine or for 5 months after stopping it. Talk with your doctor or health care professional about your birth control options while taking this medicine. Women should inform their doctor if they wish to become pregnant or think they might be pregnant. There is a potential for serious side  effects to an unborn child. Talk to your health care professional or pharmacist for more information. What side effects may I notice from receiving this medicine? Side effects that you should report to your doctor or health care professional as soon as possible:  allergic reactions like skin rash, itching or hives, swelling of the face, lips, or tongue  bone pain  breathing problems  dizziness  jaw pain, especially after dental work  redness, blistering, peeling of the skin  signs and symptoms of infection like fever or chills; cough; sore throat; pain or trouble passing urine  signs of low calcium like fast heartbeat, muscle cramps or muscle pain; pain, tingling, numbness in the hands or feet; seizures  unusual bleeding or bruising  unusually weak or tired Side effects that usually do not require medical attention (report to your doctor or health care professional if they continue or are bothersome):  constipation  diarrhea  headache  joint pain  loss of appetite  muscle pain  runny nose  tiredness  upset stomach This list may not describe all possible side effects. Call your doctor for medical advice about side effects. You may report side effects to FDA at 1-800-FDA-1088. Where should I keep my medicine? This medicine is only given in a clinic, doctor's office, or other health care setting and will not be stored at home. NOTE: This sheet is a summary. It may not cover all possible information. If you have questions about this medicine, talk to your doctor, pharmacist, or health care provider.  2021 Elsevier/Gold Standard (2017-06-24 16:10:44)

## 2020-08-08 ENCOUNTER — Encounter: Payer: Self-pay | Admitting: Oncology

## 2020-09-02 DIAGNOSIS — C50911 Malignant neoplasm of unspecified site of right female breast: Secondary | ICD-10-CM | POA: Diagnosis not present

## 2020-09-02 DIAGNOSIS — F331 Major depressive disorder, recurrent, moderate: Secondary | ICD-10-CM | POA: Diagnosis not present

## 2020-09-02 DIAGNOSIS — E669 Obesity, unspecified: Secondary | ICD-10-CM | POA: Diagnosis not present

## 2020-09-02 DIAGNOSIS — I714 Abdominal aortic aneurysm, without rupture: Secondary | ICD-10-CM | POA: Diagnosis not present

## 2020-09-02 DIAGNOSIS — Z79899 Other long term (current) drug therapy: Secondary | ICD-10-CM | POA: Diagnosis not present

## 2020-09-02 DIAGNOSIS — G43709 Chronic migraine without aura, not intractable, without status migrainosus: Secondary | ICD-10-CM | POA: Diagnosis not present

## 2020-09-02 DIAGNOSIS — E785 Hyperlipidemia, unspecified: Secondary | ICD-10-CM | POA: Diagnosis not present

## 2020-09-02 DIAGNOSIS — E1129 Type 2 diabetes mellitus with other diabetic kidney complication: Secondary | ICD-10-CM | POA: Diagnosis not present

## 2020-09-02 DIAGNOSIS — Z0000 Encounter for general adult medical examination without abnormal findings: Secondary | ICD-10-CM | POA: Diagnosis not present

## 2020-09-02 DIAGNOSIS — M461 Sacroiliitis, not elsewhere classified: Secondary | ICD-10-CM | POA: Diagnosis not present

## 2020-09-02 DIAGNOSIS — K580 Irritable bowel syndrome with diarrhea: Secondary | ICD-10-CM | POA: Diagnosis not present

## 2020-09-02 DIAGNOSIS — M810 Age-related osteoporosis without current pathological fracture: Secondary | ICD-10-CM | POA: Diagnosis not present

## 2020-09-09 DIAGNOSIS — C50911 Malignant neoplasm of unspecified site of right female breast: Secondary | ICD-10-CM | POA: Diagnosis not present

## 2020-09-10 DIAGNOSIS — C50911 Malignant neoplasm of unspecified site of right female breast: Secondary | ICD-10-CM | POA: Diagnosis not present

## 2020-09-23 DIAGNOSIS — C50911 Malignant neoplasm of unspecified site of right female breast: Secondary | ICD-10-CM | POA: Diagnosis not present

## 2020-10-23 DIAGNOSIS — C50811 Malignant neoplasm of overlapping sites of right female breast: Secondary | ICD-10-CM | POA: Diagnosis not present

## 2020-10-23 DIAGNOSIS — R922 Inconclusive mammogram: Secondary | ICD-10-CM | POA: Diagnosis not present

## 2020-11-04 DIAGNOSIS — H6123 Impacted cerumen, bilateral: Secondary | ICD-10-CM | POA: Diagnosis not present

## 2020-12-04 DIAGNOSIS — R519 Headache, unspecified: Secondary | ICD-10-CM | POA: Diagnosis not present

## 2020-12-08 DIAGNOSIS — I7143 Infrarenal abdominal aortic aneurysm, without rupture: Secondary | ICD-10-CM | POA: Diagnosis not present

## 2020-12-08 DIAGNOSIS — I714 Abdominal aortic aneurysm, without rupture, unspecified: Secondary | ICD-10-CM | POA: Diagnosis not present

## 2021-01-26 DIAGNOSIS — M791 Myalgia, unspecified site: Secondary | ICD-10-CM | POA: Diagnosis not present

## 2021-01-26 DIAGNOSIS — Z20828 Contact with and (suspected) exposure to other viral communicable diseases: Secondary | ICD-10-CM | POA: Diagnosis not present

## 2021-01-26 DIAGNOSIS — R070 Pain in throat: Secondary | ICD-10-CM | POA: Diagnosis not present

## 2021-01-26 NOTE — Progress Notes (Incomplete)
She[place]  [address] [place],  [place]  [address] [phone]  Clinic Day:  01/26/2021  Referring physician: Venetia Maxon, [name], *  This document serves as a record of services personally performed by Hosie Poisson, MD. It was created on their behalf by Curry,Lauren E, a trained medical scribe. The creation of this record is based on the scribe's personal observations and the provider's statements to them.  CHIEF COMPLAINT:  CC: Stage IIB (T2 N39mc M0) hormone positive breast cancer   Current Treatment:  Anastrazole   HISTORY OF PRESENT ILLNESS:  Sheena [place]FOR[place] a 75y.o. female with a history of stage IIB (T2 N168m M0) hormone positive breast cancer diagnosed in July 2019.  Her history dates back to May 2019 when a breast cancer was discovered on a screening mammogram, which revealed a possible mass in the right breast.  Diagnostic right mammogram revealed a mass in the superior central right breast, which measure 1.3 cm on ultrasound.  There was also a second similar mass measuring 1.1 cm, as well as borderline cortical thickening of a single right axilla.  Biopsies of both lesions were done.  The first revealed a grade 1, invasive lobular carcinoma along with lobular carcinoma in situ and the other biopsy site revealed a 5 mm focus of proliferative fibrocystic changes.  Estrogen and progesterone receptors were highly positive and HER 2 negative.  Ki 67 was 1%.  MRI  breast confirmed multifocal disease with adjacent enhancing masses in the upper inner quadrant of the right breast.  No other malignancy was found in either breast and no pathologic lymphadenopathy was seen.  She was treated with right breast lumpectomy in August 2019.  Pathology revealed a 4.5 cm, grade 2, invasive lobular carcinoma with lobular carcinoma in situ.  Two lymph nodes had micrometastases and 2 lymph nodes were negative for metastasis.  Margins were clear.  No  lymphovascular or perineural invasion was seen.  EndoPredict testing revealed a high risk recurrence score.  Due to her personal and family history of malignancy, she underwent testing for hereditary cancer syndromes with the [employer] myRisk hereditary cancer panel test.  This did not reveal any clinically significant mutation or variant of uncertain significance.  She was recommended for adjuvant chemotherapy with docetaxel/cyclophosphamide due to her high risk EndoPredict score.  She had difficulty tolerating treatment due to severe bone pain and headaches.  She then developed a subsegmental right lower lobe pulmonary embolism in October 2019.  She was initially treated with enoxaparin 1 milligram/kilogram twice daily for 1 month, then 1.5 milligrams/kilogram daily.  She was switched to rivaroxaban in December.  She completed 4 cycles of docetaxel/cyclophosphamide in December 2019. Due to toxicities, the chemotherapy doses were decreased by 20% with her 2nd cycle.  She required IV fluid support and pain medication throughout chemotherapy.   Bone density scan in February 2020 revealed osteoporosis, with a T-score of -2.2 of the spine for osteopenia and -2.6 for the right femur.  We really do not have a comparison study since 2012 and she had osteoporosis at that time, so this not a new problem, but has gone untreated.  She did not tolerate alendronate previously.  She is on calcium with vitamin-D 1500 mg daily.  She was started on anastrozole 1 mg daily at the end of January 2020.  Since completion of chemotherapy, the patient has complained of cough.  We recommended treatment of the osteoporosis with Prolia and she started  that in May 2020 and had an increase in bone pain.  She continued to complain of bone pain in August.  She was seen in November 2020 prior to 2nd dose of Prolia, at which time she reported back pain.  MRI through her orthopedic office apparently revealed degenerative disc changes.  She also  reported chronic nonproductive cough.  We recommended she continue pantoprazole 40 mg daily, as we felt acid reflux could be contributing to her cough.  She had insomnia, despite lorazepam at bedtime.  She was given trazodone 50 mg 1-2 at bedtime as needed, but was not taking it regularly.  She reported a cough for 2 months when we saw her in February, which finally resolved after a 2nd course of antibiotics and Nasonex.  Bone density from June 2022 revealed stable to improved osteoporosis with a T-score of -2.6 of the left femur, stable. Dual femur total mean measures -2.0, stable, and considered osteopenic. AP spine measures -1.4, previously -2.2.  INTERVAL HISTORY:  Sheena [place] is here for 4 month evaluation. She has been well since last visit. She denies fever, chills, nausea or vomiting. She denies shortness of breath, chest pain or cough. She denies issue with bowel or bladder. She is tolerating anastrazole well. She continues with prolia injections and her most recent Dexa scan was this month.  Sheena [place] is here for routine follow up ***. Annual bilateral mammogram and left breast ultrasound from August was clear.    Her  appetite is good, and she has gained/lost _ pounds since her last visit.  She denies fever, chills or other signs of infection.  She denies nausea, vomiting, bowel issues, or abdominal pain.  She denies sore throat, cough, dyspnea, or chest pain.  REVIEW OF SYSTEMS:  Review of Systems  Constitutional: Negative.  Negative for appetite change, chills, fatigue, fever and unexpected weight change.  HENT:  Negative.    Eyes: Negative.   Respiratory: Negative.  Negative for chest tightness, cough, hemoptysis, shortness of breath and wheezing.   Cardiovascular: Negative.  Negative for chest pain, leg swelling and palpitations.  Gastrointestinal: Negative.  Negative for abdominal distention, abdominal pain, blood in stool, constipation, diarrhea, nausea and vomiting.  Endocrine: Negative.    Genitourinary: Negative.  Negative for difficulty urinating, dysuria, frequency and hematuria.   Musculoskeletal: Negative.  Negative for arthralgias, back pain, flank pain, gait problem and myalgias.  Skin: Negative.   Neurological: Negative.  Negative for dizziness, extremity weakness, gait problem, headaches, light-headedness, numbness, seizures and speech difficulty.  Hematological: Negative.   Psychiatric/Behavioral: Negative.  Negative for depression and sleep disturbance. The patient is not nervous/anxious.     VITALS:  There were no vitals taken for this visit.  Wt Readings from Last 3 Encounters:  08/04/20 168 lb 4.8 oz (76.3 kg)  01/31/20 172 lb 8 oz (78.2 kg)  01/29/20 173 lb 1.6 oz (78.5 kg)    There is no height or weight on file to calculate BMI.  Performance status (ECOG): 1 - Symptomatic but completely ambulatory  PHYSICAL EXAM:  Physical Exam Constitutional:      General: She is not in acute distress.    Appearance: Normal appearance. She is normal weight.  HENT:     Head: Normocephalic and atraumatic.  Eyes:     General: No scleral icterus.    Extraocular Movements: Extraocular movements intact.     Conjunctiva/sclera: Conjunctivae normal.     Pupils: Pupils are equal, round, and reactive to light.  Cardiovascular:  Rate and Rhythm: Normal rate and regular rhythm.     Pulses: Normal pulses.     Heart sounds: Normal heart sounds. No murmur heard.   No friction rub. No gallop.  Pulmonary:     Effort: Pulmonary effort is normal. No respiratory distress.     Breath sounds: Normal breath sounds.  Abdominal:     General: Bowel sounds are normal. There is no distension.     Palpations: Abdomen is soft. There is no hepatomegaly, splenomegaly or mass.     Tenderness: There is no abdominal tenderness.  Musculoskeletal:        General: Normal range of motion.     Cervical back: Normal range of motion and neck supple.     Right lower leg: No edema.     Left  lower leg: No edema.  Lymphadenopathy:     Cervical: No cervical adenopathy.     Upper Body:     Right upper body: No supraclavicular or axillary adenopathy.     Left upper body: No supraclavicular or axillary adenopathy.     Lower Body: No right inguinal adenopathy. No left inguinal adenopathy.  Skin:    General: Skin is warm and dry.  Neurological:     General: No focal deficit present.     Mental Status: She is alert and oriented to person, place, and time. Mental status is at baseline.  Psychiatric:        Mood and Affect: Mood normal.        Behavior: Behavior normal.        Thought Content: Thought content normal.        Judgment: Judgment normal.    LABS:   CBC Latest Ref Rng & Units 08/04/2020 01/29/2020 12/07/2017  WBC - 10.1 6.4 5.3  Hemoglobin 12.0 - 16.0 14.1 13.5 12.8  Hematocrit 36 - 46 43 41 40.6  Platelets 150 - 399 272 257 296   CMP Latest Ref Rng & Units 08/04/2020 01/29/2020 12/07/2017  Glucose 70 - 99 mg/dL - - 115(H)  BUN 4 - _0 Creatinine 0.5 - 1.1 0.8 0.9 0.89  Sodium 137 - 147 135(A) 139 138  Potassium 3.4 - 5.3 4.2 4.0 3.8  Chloride 99 - 108 101 105 106  CO2 13 - 22 25(A) 28(A) 24  Calcium 8.7 - 10.7 9.4 9.2 8.8(L)  Total Protein 6.5 - 8.1 g/dL - - -  Total Bilirubin 0.3 - 1.2 mg/dL - - -  Alkaline Phos 25 - 125 74 62 -  AST 13 - 35 27 24 -  ALT 7 - 35 19 18 -     No results found for: CEA1 / No results found for: CEA1 No results found for: PSA1 No results found for: XIH038 No results found for: UEK800  No results found for: TOTALPROTELP, ALBUMINELP, A1GS, A2GS, BETS, BETA2SER, GAMS, MSPIKE, SPEI No results found for: TIBC, FERRITIN, IRONPCTSAT No results found for: LDH  STUDIES:   EXAM: 10/23/20 DIGITAL DIAGNOSTIC BILATERAL MAMMOGRAM WITH TOMOSYNTHESIS AND CAD  TECHNIQUE: Bilateral digital diagnostic mammography and breast tomosynthesis was performed. The images were evaluated with computer-aided detection.  COMPARISON:  Previous exam(s).  ACR Breast Density Category c: The breast tissue is heterogeneously dense, which may obscure small masses.  FINDINGS: The recur right breast lumpectomy site appears stable. No suspicious calcifications, masses or areas of distortion are seen in the bilateral breasts.  IMPRESSION: Stable right breast lumpectomy site. No mammographic evidence of malignancy in the  bilateral breasts.  HISTORY:   Allergies:  Allergies  Allergen Reactions   Other     Other reaction(s): Other (See Comments) Pt states medication after sx made her "unresponsive", "sounds like oxycodone but can't remember the name"   Codeine Rash   Hydrocodone Rash    Current Medications: Current Outpatient Medications  Medication Sig Dispense Refill   anastrozole (ARIMIDEX) 1 MG tablet TAKE 1 TABLET BY MOUTH ONCE DAILY 90 tablet 3   Calcium-Vitamin D-Vitamin K 650-12.5-40 MG-MCG-MCG CHEW Chew 1,300 mg by mouth daily.     dicyclomine (BENTYL) 20 MG tablet Take 20 mg by mouth 4 (four) times daily.     meloxicam (MOBIC) 15 MG tablet Take 1 tablet by mouth daily.     Multiple Vitamin (MULTI-VITAMIN) tablet Take 2 tablets by mouth daily.     ondansetron (ZOFRAN) 4 MG tablet Take 1 tablet by mouth every 4 (four) hours as needed.  2   pantoprazole (PROTONIX) 40 MG tablet Take 1 tablet by mouth daily.     polyethylene glycol (MIRALAX / GLYCOLAX) 17 g packet Take 17 g by mouth daily.     promethazine (PHENERGAN) 12.5 MG tablet Take by mouth.     rizatriptan (MAXALT) 10 MG tablet Take 5-10 mg by mouth 2 (two) times daily as needed for migraine. May repeat in 2 hours if needed     simvastatin (ZOCOR) 40 MG tablet Take 40 mg by mouth daily.     tiZANidine (ZANAFLEX) 4 MG capsule Take 4 mg by mouth 3 (three) times daily.     traZODone (DESYREL) 50 MG tablet TAKE 1 TABLET BY MOUTH AT BEDTIME 90 tablet 2   venlafaxine XR (EFFEXOR-XR) 75 MG 24 hr capsule Take 75 mg by mouth daily with breakfast.     No current  facility-administered medications for this visit.     ASSESSMENT & PLAN:   Assessment:  1. Stage IIIB breast cancer.  She remains without evidence of recurrence.  She will continue anastrozole for least a total of 5 years, as long as we can treat her osteoporosis.    2. History of subsegmental pulmonary embolism in October 2019.  She treated with enoxaparin, then rivaroxaban.  Anticoagulation was discontinued in February 2020. She does not have symptoms suggestive of recurrence.  3. Osteoporosis, for which she is receiving Prolia injections every 6 months as of May 2020.  She had significant bone pain associated with this, but started Claritin and did not have as much bone pain with her next injection.    Plan: She continues to do well. She will continue with anastrozole and prolia. We will see her back in clinic in 6 months for repeat evaluation. She verbalizes understanding of and agreement to the plans discussed today. She knows to call the office should any new questions or concerns arise.   I provided *** minutes of face-to-face time during this this encounter and > 50% was spent counseling as documented under my assessment and plan.    I, Rita Ohara, am acting as scribe for Derwood Kaplan, MD  I have reviewed this report as typed by the medical scribe, and it is complete and accurate.

## 2021-01-28 ENCOUNTER — Other Ambulatory Visit: Payer: Self-pay | Admitting: Pharmacist

## 2021-01-30 ENCOUNTER — Other Ambulatory Visit: Payer: Self-pay | Admitting: Oncology

## 2021-01-30 DIAGNOSIS — C50811 Malignant neoplasm of overlapping sites of right female breast: Secondary | ICD-10-CM

## 2021-01-30 DIAGNOSIS — Z170 Estrogen receptor positive status [ER+]: Secondary | ICD-10-CM

## 2021-02-02 ENCOUNTER — Other Ambulatory Visit: Payer: PPO

## 2021-02-02 ENCOUNTER — Ambulatory Visit: Payer: PPO | Admitting: Oncology

## 2021-02-03 ENCOUNTER — Encounter: Payer: Self-pay | Admitting: Oncology

## 2021-02-04 ENCOUNTER — Ambulatory Visit: Payer: PPO

## 2021-02-06 ENCOUNTER — Other Ambulatory Visit: Payer: Self-pay

## 2021-02-10 ENCOUNTER — Encounter: Payer: Self-pay | Admitting: Oncology

## 2021-02-10 ENCOUNTER — Ambulatory Visit: Payer: PPO | Admitting: Hematology and Oncology

## 2021-02-10 ENCOUNTER — Other Ambulatory Visit: Payer: PPO

## 2021-02-11 ENCOUNTER — Ambulatory Visit: Payer: PPO

## 2021-02-17 DIAGNOSIS — H9190 Unspecified hearing loss, unspecified ear: Secondary | ICD-10-CM | POA: Diagnosis not present

## 2021-02-17 DIAGNOSIS — H9319 Tinnitus, unspecified ear: Secondary | ICD-10-CM | POA: Diagnosis not present

## 2021-02-17 DIAGNOSIS — H61303 Acquired stenosis of external ear canal, unspecified, bilateral: Secondary | ICD-10-CM | POA: Diagnosis not present

## 2021-02-17 DIAGNOSIS — H6122 Impacted cerumen, left ear: Secondary | ICD-10-CM | POA: Diagnosis not present

## 2021-02-20 NOTE — Progress Notes (Deleted)
Patient Care Team: Street, [name], MD as PCP - General (Family Medicine) Derwood Kaplan, MD as Consulting Physician (Oncology) [name], MD as Consulting Physician (Radiation Oncology) [name], MD as Referring Physician (General Surgery)  Clinic Day:  02/20/2021  Referring physician: Street, [name], *  ASSESSMENT & PLAN:   Assessment & Plan: No problem-specific Assessment & Plan notes found for this encounter.    The patient understands the plans discussed today and is in agreement with them.  She knows to contact our office if she develops concerns prior to her next appointment.    [name], NP  [facility] [address] [place] [address] Dept: [phone] Dept Fax: [phone]   No orders of the defined types were placed in this encounter.     CHIEF COMPLAINT:  CC: A 75 year old female with history of breast cancer here for   Current Treatment:  ***  INTERVAL HISTORY:  Sheena [place] is here today for repeat clinical assessment. She denies fevers or chills. She denies pain. Her appetite is good. Her weight {Weight change:10426}.  I have reviewed the past medical history, past surgical history, social history and family history with the patient and they are unchanged from previous note.  ALLERGIES:  is allergic to other, codeine, and hydrocodone.  MEDICATIONS:  Current Outpatient Medications  Medication Sig Dispense Refill   anastrozole (ARIMIDEX) 1 MG tablet TAKE 1 TABLET BY MOUTH ONCE DAILY 90 tablet 3   Calcium-Vitamin D-Vitamin K 650-12.5-40 MG-MCG-MCG CHEW Chew 1,300 mg by mouth daily.     diclofenac (VOLTAREN) 75 MG EC tablet Take 75 mg by mouth 2 (two) times daily.     dicyclomine (BENTYL) 20 MG tablet Take 20 mg by mouth 4 (four) times daily.     LAGEVRIO 200 MG CAPS capsule Take by mouth.     meloxicam (MOBIC) 15 MG tablet Take 1 tablet by mouth daily.      Multiple Vitamin (MULTI-VITAMIN) tablet Take 2 tablets by mouth daily.     ondansetron (ZOFRAN) 4 MG tablet Take 1 tablet by mouth every 4 (four) hours as needed.  2   pantoprazole (PROTONIX) 40 MG tablet Take 1 tablet by mouth daily.     polyethylene glycol (MIRALAX / GLYCOLAX) 17 g packet Take 17 g by mouth daily.     promethazine (PHENERGAN) 12.5 MG tablet Take by mouth.     propranolol ER (INDERAL LA) 80 MG 24 hr capsule Take 80 mg by mouth daily.     rizatriptan (MAXALT) 10 MG tablet Take 5-10 mg by mouth 2 (two) times daily as needed for migraine. May repeat in 2 hours if needed     simvastatin (ZOCOR) 20 MG tablet Take 20 mg by mouth at bedtime.     simvastatin (ZOCOR) 40 MG tablet Take 40 mg by mouth daily.     tiZANidine (ZANAFLEX) 4 MG capsule Take 4 mg by mouth 3 (three) times daily.     traMADol-acetaminophen (ULTRACET) 37.5-325 MG tablet Take by mouth.     traZODone (DESYREL) 50 MG tablet TAKE 1 TABLET BY MOUTH AT BEDTIME 90 tablet 2   venlafaxine XR (EFFEXOR-XR) 75 MG 24 hr capsule Take 75 mg by mouth daily with breakfast.     No current facility-administered medications for this visit.    HISTORY OF PRESENT ILLNESS:   Oncology History   No history exists.      REVIEW OF SYSTEMS:  Constitutional: Denies fevers, chills or abnormal weight loss Eyes: Denies blurriness of vision Ears, nose, mouth, throat, and face: Denies mucositis or sore throat Respiratory: Denies cough, dyspnea or wheezes Cardiovascular: Denies palpitation, chest discomfort or lower extremity swelling Gastrointestinal:  Denies nausea, heartburn or change in bowel habits Skin: Denies abnormal skin rashes Lymphatics: Denies new lymphadenopathy or easy bruising Neurological:Denies numbness, tingling or new weaknesses Behavioral/Psych: Mood is stable, no new changes  All other systems were reviewed with the patient and are negative.   VITALS:  There were no vitals taken for this visit.  Wt  Readings from Last 3 Encounters:  08/04/20 168 lb 4.8 oz (76.3 kg)  01/31/20 172 lb 8 oz (78.2 kg)  01/29/20 173 lb 1.6 oz (78.5 kg)    There is no height or weight on file to calculate BMI.  Performance status (ECOG): {CHL ONC [id number]  PHYSICAL EXAM:   GENERAL:alert, no distress and comfortable SKIN: skin color, texture, turgor are normal, no rashes or significant lesions EYES: normal, Conjunctiva are pink and non-injected, sclera clear OROPHARYNX:no exudate, no erythema and lips, buccal mucosa, and tongue normal  NECK: supple, thyroid normal size, non-tender, without nodularity LYMPH:  no palpable lymphadenopathy in the cervical, axillary or inguinal LUNGS: clear to auscultation and percussion with normal breathing effort HEART: regular rate & rhythm and no murmurs and no lower extremity edema ABDOMEN:abdomen soft, non-tender and normal bowel sounds Musculoskeletal:no cyanosis of digits and no clubbing  NEURO: alert & oriented x 3 with fluent speech, no focal motor/sensory deficits  LABORATORY DATA:  I have reviewed the data as listed    Component Value Date/Time   NA 135 (A) 08/04/2020 0000   K 4.2 08/04/2020 0000   CL 101 08/04/2020 0000   CO2 25 (A) 08/04/2020 0000   GLUCOSE 115 (H) 12/07/2017 1034   BUN 17 08/04/2020 0000   CREATININE 0.8 08/04/2020 0000   CREATININE 0.89 12/07/2017 1034   CALCIUM 9.4 08/04/2020 0000   PROT 6.6 10/17/2017 1414   ALBUMIN 4.6 08/04/2020 0000   AST 27 08/04/2020 0000   ALT 19 08/04/2020 0000   ALKPHOS 74 08/04/2020 0000   BILITOT 0.5 10/17/2017 1414   GFRNONAA >60 12/07/2017 1034   GFRAA >60 12/07/2017 1034    No results found for: SPEP, UPEP  Lab Results  Component Value Date   WBC 10.1 08/04/2020   NEUTROABS 7.17 08/04/2020   HGB 14.1 08/04/2020   HCT 43 08/04/2020   MCV 94 01/29/2020   PLT 272 08/04/2020      Chemistry      Component Value Date/Time   NA 135 (A) 08/04/2020 0000   K 4.2 08/04/2020 0000   CL  101 08/04/2020 0000   CO2 25 (A) 08/04/2020 0000   BUN 17 08/04/2020 0000   CREATININE 0.8 08/04/2020 0000   CREATININE 0.89 12/07/2017 1034   GLU 92 08/04/2020 0000      Component Value Date/Time   CALCIUM 9.4 08/04/2020 0000   ALKPHOS 74 08/04/2020 0000   AST 27 08/04/2020 0000   ALT 19 08/04/2020 0000   BILITOT 0.5 10/17/2017 1414       RADIOGRAPHIC STUDIES: I have personally reviewed the radiological images as listed and agreed with the findings in the report. No results found.

## 2021-02-24 ENCOUNTER — Other Ambulatory Visit: Payer: PPO

## 2021-02-24 ENCOUNTER — Ambulatory Visit: Payer: PPO | Admitting: Hematology and Oncology

## 2021-02-25 ENCOUNTER — Telehealth: Payer: Self-pay | Admitting: Hematology and Oncology

## 2021-02-25 ENCOUNTER — Inpatient Hospital Stay: Payer: PPO

## 2021-02-25 NOTE — Telephone Encounter (Signed)
I contacted pt to verify health insurance for 2023; LVM. °

## 2021-02-26 ENCOUNTER — Other Ambulatory Visit: Payer: Self-pay | Admitting: Pharmacist

## 2021-03-03 DIAGNOSIS — R051 Acute cough: Secondary | ICD-10-CM | POA: Diagnosis not present

## 2021-03-03 DIAGNOSIS — R059 Cough, unspecified: Secondary | ICD-10-CM | POA: Diagnosis not present

## 2021-03-03 DIAGNOSIS — H00019 Hordeolum externum unspecified eye, unspecified eyelid: Secondary | ICD-10-CM | POA: Diagnosis not present

## 2021-03-03 DIAGNOSIS — J449 Chronic obstructive pulmonary disease, unspecified: Secondary | ICD-10-CM | POA: Diagnosis not present

## 2021-03-04 ENCOUNTER — Other Ambulatory Visit: Payer: Self-pay

## 2021-03-04 ENCOUNTER — Inpatient Hospital Stay (INDEPENDENT_AMBULATORY_CARE_PROVIDER_SITE_OTHER): Payer: PPO | Admitting: Oncology

## 2021-03-04 ENCOUNTER — Inpatient Hospital Stay: Payer: PPO | Attending: Oncology

## 2021-03-04 ENCOUNTER — Encounter: Payer: Self-pay | Admitting: Oncology

## 2021-03-04 ENCOUNTER — Other Ambulatory Visit: Payer: Self-pay | Admitting: Oncology

## 2021-03-04 ENCOUNTER — Telehealth: Payer: Self-pay | Admitting: Oncology

## 2021-03-04 ENCOUNTER — Other Ambulatory Visit: Payer: Self-pay | Admitting: Hematology and Oncology

## 2021-03-04 VITALS — BP 134/90 | HR 97 | Temp 97.7°F | Resp 18 | Ht 59.0 in | Wt 168.5 lb

## 2021-03-04 DIAGNOSIS — C50811 Malignant neoplasm of overlapping sites of right female breast: Secondary | ICD-10-CM

## 2021-03-04 DIAGNOSIS — M810 Age-related osteoporosis without current pathological fracture: Secondary | ICD-10-CM

## 2021-03-04 DIAGNOSIS — Z170 Estrogen receptor positive status [ER+]: Secondary | ICD-10-CM

## 2021-03-04 DIAGNOSIS — C50211 Malignant neoplasm of upper-inner quadrant of right female breast: Secondary | ICD-10-CM | POA: Insufficient documentation

## 2021-03-04 DIAGNOSIS — D649 Anemia, unspecified: Secondary | ICD-10-CM | POA: Diagnosis not present

## 2021-03-04 LAB — BASIC METABOLIC PANEL WITH GFR
BUN: 12 (ref 4–21)
CO2: 25 — AB (ref 13–22)
Chloride: 104 (ref 99–108)
Creatinine: 1 (ref 0.5–1.1)
Glucose: 130
Potassium: 4.1 (ref 3.4–5.3)
Sodium: 138 (ref 137–147)

## 2021-03-04 LAB — CBC: RBC: 4.44 (ref 3.87–5.11)

## 2021-03-04 LAB — CBC AND DIFFERENTIAL
HCT: 40 (ref 36–46)
Hemoglobin: 12.9 (ref 12.0–16.0)
Neutrophils Absolute: 5.17
Platelets: 282 (ref 150–399)
WBC: 7.6

## 2021-03-04 LAB — HEPATIC FUNCTION PANEL
ALT: 20 (ref 7–35)
AST: 29 (ref 13–35)
Alkaline Phosphatase: 67 (ref 25–125)
Bilirubin, Total: 0.5

## 2021-03-04 LAB — COMPREHENSIVE METABOLIC PANEL WITH GFR
Albumin: 4.1 (ref 3.5–5.0)
Calcium: 9.4 (ref 8.7–10.7)

## 2021-03-04 NOTE — Telephone Encounter (Signed)
Per 1/4 los next appt scheduled and given to patient °

## 2021-03-04 NOTE — Progress Notes (Signed)
Sheena Joyce  [address] [place],  [place]  [address] [phone]  Clinic Day:  03/04/2021  Referring physician: Emmaline Kluver, *   CHIEF COMPLAINT:  CC: A 76 year old woman with stage IIB (T2 N77mc M0) hormone positive breast cancer diagnosed in July 2019 here for 4 month evaluation  Current Treatment:  Anastrazole   HISTORY OF PRESENT ILLNESS:  Sheena Joyce a 76y.o. female with a history of stage IIB (T2 N158m M0) hormone positive breast cancer diagnosed in July 2019.  Her history dates back to May 2019 when a breast cancer was discovered on a screening mammogram, which revealed a possible mass in the right breast.  Diagnostic right mammogram revealed a mass in the superior central right breast, which measure 1.3 cm on ultrasound.  There was also a second similar mass measuring 1.1 cm, as well as borderline cortical thickening of a single right axilla.  Biopsies of both lesions were done.  The first revealed a grade 1, invasive lobular carcinoma along with lobular carcinoma in situ and the other biopsy site revealed a 5 mm focus of proliferative fibrocystic changes.  Estrogen and progesterone receptors were highly positive and HER 2 negative.  Ki 67 was 1%.  MRI  breast confirmed multifocal disease with adjacent enhancing masses in the upper inner quadrant of the right breast.  No other malignancy was found in either breast and no pathologic lymphadenopathy was seen.  She was treated with right breast lumpectomy in August 2019.  Pathology revealed a 4.5 cm, grade 2, invasive lobular carcinoma with lobular carcinoma in situ.  Two lymph nodes had micrometastases and 2 lymph nodes were negative for metastasis.  Margins were clear.  No lymphovascular or perineural invasion was seen.  EndoPredict testing revealed a high risk recurrence score.  Due to her personal and family history of malignancy, she underwent testing for hereditary cancer syndromes with  the [employer] myRisk hereditary cancer panel test.  This did not reveal any clinically significant mutation or variant of uncertain significance.  She was recommended for adjuvant chemotherapy with docetaxel/cyclophosphamide due to her high risk EndoPredict score.  She had difficulty tolerating treatment due to severe bone pain and headaches.  She then developed a subsegmental right lower lobe pulmonary embolism in October 2019.  She was initially treated with enoxaparin 1 milligram/kilogram twice daily for 1 month, then 1.5 milligrams/kilogram daily.  She was switched to rivaroxaban in December.  She completed 4 cycles of docetaxel/cyclophosphamide in December 2019. Due to toxicities, the chemotherapy doses were decreased by 20% with her 2nd cycle.  She required IV fluid support and pain medication throughout chemotherapy.   Bone density scan in February 2020 revealed osteoporosis, with a T-score of -2.2 of the spine for osteopenia and -2.6 for the right femur.  We really do not have a comparison study since 2012 and she had osteoporosis at that time, so this not a new problem, but has gone untreated.  She did not tolerate alendronate previously.  She is on calcium with vitamin-D 1500 mg daily.  She was started on anastrozole 1 mg daily at the end of January 2020.  Since completion of chemotherapy, the patient has complained of cough.  We recommended treatment of the osteoporosis with Prolia and she started that in May 2020 and had an increase in bone pain.  She continued to complain of bone pain in August.  She was seen in November 2020 prior to 2nd dose of  Prolia, at which time she reported back pain.  MRI through her orthopedic office apparently revealed degenerative disc changes.  She also reported chronic nonproductive cough.  We recommended she continue pantoprazole 40 mg daily, as we felt acid reflux could be contributing to her cough.  She had insomnia, despite lorazepam at bedtime.  She was given trazodone  50 mg 1-2 at bedtime as needed, but was not taking it regularly.  She reported a cough for 2 months when we saw her in February, which finally resolved after a 2nd course of antibiotics and Nasonex.   Annual bilateral mammogram in August of 2022 was clear.   INTERVAL HISTORY:  Sheena Joyce is here for 4 month evaluation. She continues her anastrazole sine January of 2020 without significant difficulty. She has had a sty of the right eye, which is resolving.  She had COVID 19 in November 2022 and still has persistent cough, occasionally productive of white sputum. She had a chest x-ray yesterday and I see that is clear. Dr. Venetia Maxon has prescribed tessalon perles but she does not require an antibiotic at this time. She did have a bone density scan in June of 2022 which still shows osteoporosis of the femur, with a T score which is stable at 2.6.  The spine shows improvement, from a T score of -2.2 down to -1.4. She has occasional nausea. She denies fever, chills, abdominal pain or vomiting. She denies shortness of breath, chest pain or cough. She denies issue with bowel or bladder. She continues with prolia injections, and is due tomorrow. REVIEW OF SYSTEMS:  Review of Systems  Constitutional: Negative.  Negative for appetite change, chills, diaphoresis, fatigue, fever and unexpected weight change.  HENT:  Negative.  Negative for hearing loss, lump/mass, mouth sores, nosebleeds, sore throat, tinnitus, trouble swallowing and voice change.   Eyes: Negative.  Negative for eye problems and icterus.  Respiratory: Negative.  Negative for chest tightness, cough, hemoptysis, shortness of breath and wheezing.   Cardiovascular: Negative.  Negative for chest pain, leg swelling and palpitations.  Gastrointestinal: Negative.  Negative for abdominal distention, abdominal pain, blood in stool, constipation, diarrhea, nausea, rectal pain and vomiting.  Endocrine: Negative.  Negative for hot flashes.  Genitourinary: Negative.   Negative for bladder incontinence, difficulty urinating, dyspareunia, dysuria, frequency, hematuria and nocturia.   Musculoskeletal: Negative.  Negative for arthralgias, back pain, flank pain, gait problem, myalgias, neck pain and neck stiffness.  Skin: Negative.  Negative for itching, rash and wound.  Neurological: Negative.  Negative for dizziness, extremity weakness, gait problem, headaches, light-headedness, numbness, seizures and speech difficulty.  Hematological: Negative.  Negative for adenopathy. Does not bruise/bleed easily.  Psychiatric/Behavioral: Negative.  Negative for confusion, decreased concentration, depression, sleep disturbance and suicidal ideas. The patient is not nervous/anxious.   All other systems reviewed and are negative.   VITALS:  There were no vitals taken for this visit.  Wt Readings from Last 3 Encounters:  08/04/20 168 lb 4.8 oz (76.3 kg)  01/31/20 172 lb 8 oz (78.2 kg)  01/29/20 173 lb 1.6 oz (78.5 kg)    There is no height or weight on file to calculate BMI.  Performance status (ECOG): 1 - Symptomatic but completely ambulatory  PHYSICAL EXAM:  Physical Exam Constitutional:      General: She is not in acute distress.    Appearance: Normal appearance. She is normal weight. She is not ill-appearing, toxic-appearing or diaphoretic.  HENT:     Head: Normocephalic and atraumatic.  Right Ear: Tympanic membrane, ear canal and external ear normal. There is no impacted cerumen.     Left Ear: Tympanic membrane, ear canal and external ear normal. There is no impacted cerumen.     Nose: Nose normal. No congestion or rhinorrhea.     Mouth/Throat:     Mouth: Mucous membranes are moist.     Pharynx: No oropharyngeal exudate or posterior oropharyngeal erythema.  Eyes:     General: No scleral icterus.       Right eye: No discharge.        Left eye: No discharge.     Extraocular Movements: Extraocular movements intact.     Conjunctiva/sclera: Conjunctivae  normal.     Pupils: Pupils are equal, round, and reactive to light.  Neck:     Vascular: No carotid bruit.  Cardiovascular:     Rate and Rhythm: Normal rate and regular rhythm.     Pulses: Normal pulses.     Heart sounds: No murmur heard.   No friction rub. No gallop.  Pulmonary:     Effort: Pulmonary effort is normal. No respiratory distress.     Breath sounds: Normal breath sounds. No stridor. No wheezing, rhonchi or rales.  Chest:     Chest wall: No mass, lacerations, deformity, swelling, tenderness, crepitus or edema. There is no dullness to percussion.  Breasts:    Breasts are symmetrical.     Right: Normal. No swelling, bleeding, inverted nipple, mass, nipple discharge, skin change or tenderness.     Left: Normal. No swelling, bleeding, inverted nipple, mass, nipple discharge, skin change or tenderness.  Abdominal:     General: Abdomen is flat. Bowel sounds are normal. There is no distension.     Palpations: Abdomen is soft. There is no mass.     Tenderness: There is no abdominal tenderness. There is no right CVA tenderness, left CVA tenderness, guarding or rebound.     Hernia: No hernia is present.  Musculoskeletal:        General: No swelling, tenderness, deformity or signs of injury. Normal range of motion.     Cervical back: Normal range of motion and neck supple. No rigidity or tenderness.     Right lower leg: No edema.     Left lower leg: No edema.  Lymphadenopathy:     Cervical: No cervical adenopathy.     Upper Body:     Right upper body: No supraclavicular, axillary or pectoral adenopathy.     Left upper body: No supraclavicular, axillary or pectoral adenopathy.  Skin:    General: Skin is warm and dry.     Capillary Refill: Capillary refill takes less than 2 seconds.     Coloration: Skin is not jaundiced or pale.     Findings: No bruising, erythema, lesion or rash.  Neurological:     General: No focal deficit present.     Mental Status: She is alert and oriented  to person, place, and time. Mental status is at baseline.     Cranial Nerves: No cranial nerve deficit.     Sensory: No sensory deficit.     Motor: No weakness.     Coordination: Coordination normal.     Gait: Gait normal.     Deep Tendon Reflexes: Reflexes normal.  Psychiatric:        Mood and Affect: Mood normal.        Behavior: Behavior normal.        Thought Content: Thought content normal.  Judgment: Judgment normal.   Lymph nodes:   There is no cervical, clavicular, axillary or inguinal lymphadenopathy.   LABS:   CBC Latest Ref Rng & Units 08/04/2020 01/29/2020 12/07/2017  WBC - 10.1 6.4 5.3  Hemoglobin 12.0 - 16.0 14.1 13.5 12.8  Hematocrit 36 - 46 43 41 40.6  Platelets 150 - 399 272 257 296   CMP Latest Ref Rng & Units 08/04/2020 01/29/2020 12/07/2017  Glucose 70 - 99 mg/dL - - 115(H)  BUN 4 - 21 17 13 13   Creatinine 0.5 - 1.1 0.8 0.9 0.89  Sodium 137 - 147 135(A) 139 138  Potassium 3.4 - 5.3 4.2 4.0 3.8  Chloride 99 - 108 101 105 106  CO2 13 - 22 25(A) 28(A) 24  Calcium 8.7 - 10.7 9.4 9.2 8.8(L)  Total Protein 6.5 - 8.1 g/dL - - -  Total Bilirubin 0.3 - 1.2 mg/dL - - -  Alkaline Phos 25 - 125 74 62 -  AST 13 - 35 27 24 -  ALT 7 - 35 19 18 -     No results found for: CEA1 / No results found for: CEA1 No results found for: PSA1 No results found for: WFU932 No results found for: TFT732  No results found for: TOTALPROTELP, ALBUMINELP, A1GS, A2GS, BETS, BETA2SER, GAMS, MSPIKE, SPEI No results found for: TIBC, FERRITIN, IRONPCTSAT No results found for: LDH  STUDIES:  Exam(s): 2025-4270 DEXA/DG DEXA EXAM: DUAL X-RAY ABSORPTIOMETRY (DXA) FOR BONE MINERAL DENSITY  IMPRESSION: [facility] completed a BMD test on 08/01/2020 using the Lunar iDXA DXA System (analysis version: 13.60) manufactured by [employer]. The following summarizes the results of our evaluation. Technologist: Pena  PATIENT BIOGRAPHICAL: Name: Sheena Joyce, Sheena Joyce Patient ID:  [id number] [place] Birth  Date: August 15, 1945 Height:     60.0 in. Gender: Female Exam Date: 08/01/2020 Weight: 171.0 lbs. Indications:  m85.89      Fractures:            Treatments:  ASSESSMENT: The BMD measured at Femur Neck Left is 0.670 g/cm2 with a T-score of -2.6. This patient is considered osteoporotic according to [facility]) criteria. The scan quality is good. Patient currently takes a bone building therapy- Prolia  Site Region Measured Measured WHO Young Adult BMD Date     Age      Classification T-score AP Spine L1-L4 08/01/2020 76.2 Osteopenia -1.4 1.013 g/cm2 AP Spine L1-L4 04/03/2018 73.8 Osteopenia -2.2 0.913 g/cm2  DualFemur Neck Left 08/01/2020 76.2 Osteoporosis -2.6 0.670 g/cm2 DualFemur Neck Left 04/03/2018 73.8 Osteoporosis -2.5 0.686 g/cm2  DualFemur Total Mean 08/01/2020 76.2 Osteopenia -2.0 0.756 g/cm2 DualFemur Total Mean 04/03/2018 73.8 Osteopenia -2.0 0.752 g/cm2  World Health Organization [facility]) criteria for post-menopausal, Caucasian Women: Normal:       T-score at or above -1 SD Osteopenia:   T-score between -1 and -2.5 SD Osteoporosis: T-score at or below -2.5 SD  RECOMMENDATIONS: 1. All patients should optimize calcium and vitamin D intake. 2. Consider FDA- approved medical therapies in post menopausal women and men aged 41 years and older, based on the following: a. A hip or vertebral (clinical or morphometric) fracture. b. T- score < 2.5 of the femoral neck or spine after appropriate evaluation to exclude secondary causes. c. Low bone mass (T- score between -1.0 and -2.5 at femoral neck or spine) and a 10 -year probability of a hip fracture > 3% or a 10 -year probability of a major osteoporosis- related fracture > 20% based on the  [place]- adapted WHO algorithm. d. Clinician judgement and/ or patient preferences may indicate treatment for people with 10- year fracture probabilities above or below these levels.  FOLLOW-UP: People with diagnosed cases of osteoporosis or  at high risk for fracture should have regular bone mineral density tests. For patients eligible for Medicare, routine testing is allowed once every 2 years. The testing frequency can be increased to one year for patients who have rapidly progressing disease, those who are receiving medical therapy to restore bone mass, or have additional risk factors. I have reviewed this report and agree with the above findings.  Mark A. Thornton Papas, M.D.  [facility] Radiology   Electronically Signed   By: Lavonia Dana M.D.   On: 08/01/2020 12:39  Electronically Signed By: Burnetta Sabin MD  Electronically Signed Date/Time: 06/03/221241 Dictate Date/Time: 08/01/20 1238  Technologist: Dannette Barbara J Transcribed By: Odie Sera Transcribed Date/Time: 08/01/20 1239   HISTORY:   Past Medical History:  Diagnosis Date   Anxiety    Arthritis    Cancer of overlapping sites of right female breast ([place]) 3/00/7622   Complication of anesthesia    "became unresposive after surgery"- 2017 , aspirated after surgery and had pneumonia- 2017    Depression    Diabetes mellitus without complication ([place])    "on the borderline"   Diverticulitis    Family history of adverse reaction to anesthesia    mother slow to wake up afgter 5 days, sister had port placed and had issues - went into coma but was very sick    GERD (gastroesophageal reflux disease)    Headache    Pneumonia    PONV (postoperative nausea and vomiting)    Prolapsed uterus    and bladder    Past Surgical History:  Procedure Laterality Date   BREAST LUMPECTOMY WITH RADIOACTIVE SEED AND SENTINEL LYMPH NODE BIOPSY Right 10/19/2017   Procedure: INJECT BLUE DYE RIGHT BREAST AND RIGHT BREAST LUMPECTOMY WITH TWO RADIOACTIVE SEEDS AND RIGHT AXILLARY SENTINEL LYMPH NODE BIOPSY;  Surgeon: [name], MD;  Location: [facility];  Service: General;  Laterality: Right;   COLON SURGERY     HERNIA REPAIR     PORTACATH PLACEMENT Left 12/07/2017   Procedure:  INSERTION PORT-A-CATH WITH ULTRA SOUND;  Surgeon: [name], MD;  Location: [facility];  Service: General;  Laterality: Left;    Family History  Problem Relation Age of Onset   Osteoporosis Mother    Heart disease Father     Social History:  reports that she has never smoked. She has never used smokeless tobacco. She reports that she does not drink alcohol and does not use drugs.The patient is alone  today.  Allergies:  Allergies  Allergen Reactions   Other     Other reaction(s): Other (See Comments) Pt states medication after sx made her "unresponsive", "sounds like oxycodone but can't remember the name"   Codeine Rash   Hydrocodone Rash    Current Medications: Current Outpatient Medications  Medication Sig Dispense Refill   anastrozole (ARIMIDEX) 1 MG tablet TAKE 1 TABLET BY MOUTH ONCE DAILY 90 tablet 3   Calcium-Vitamin D-Vitamin K 650-12.5-40 MG-MCG-MCG CHEW Chew 1,300 mg by mouth daily.     diclofenac (VOLTAREN) 75 MG EC tablet Take 75 mg by mouth 2 (two) times daily.     dicyclomine (BENTYL) 20 MG tablet Take 20 mg by mouth 4 (four) times daily.     LAGEVRIO 200 MG CAPS capsule Take by mouth.     meloxicam (MOBIC)  15 MG tablet Take 1 tablet by mouth daily.     Multiple Vitamin (MULTI-VITAMIN) tablet Take 2 tablets by mouth daily.     ondansetron (ZOFRAN) 4 MG tablet Take 1 tablet by mouth every 4 (four) hours as needed.  2   pantoprazole (PROTONIX) 40 MG tablet Take 1 tablet by mouth daily.     polyethylene glycol (MIRALAX / GLYCOLAX) 17 g packet Take 17 g by mouth daily.     promethazine (PHENERGAN) 12.5 MG tablet Take by mouth.     propranolol ER (INDERAL LA) 80 MG 24 hr capsule Take 80 mg by mouth daily.     rizatriptan (MAXALT) 10 MG tablet Take 5-10 mg by mouth 2 (two) times daily as needed for migraine. May repeat in 2 hours if needed     simvastatin (ZOCOR) 20 MG tablet Take 20 mg by mouth at bedtime.     simvastatin (ZOCOR) 40 MG tablet Take 40 mg by mouth daily.      tiZANidine (ZANAFLEX) 4 MG capsule Take 4 mg by mouth 3 (three) times daily.     traMADol-acetaminophen (ULTRACET) 37.5-325 MG tablet Take by mouth.     traZODone (DESYREL) 50 MG tablet TAKE 1 TABLET BY MOUTH AT BEDTIME 90 tablet 2   venlafaxine XR (EFFEXOR-XR) 75 MG 24 hr capsule Take 75 mg by mouth daily with breakfast.     No current facility-administered medications for this visit.     ASSESSMENT & PLAN:   Assessment:  Sheena Joyce is a 76 y.o. female with:  1. Stage IIIB breast cancer.  She remains without evidence of recurrence, now 3 1/2 years out.  She will continue anastrozole for least a total of 5 years, as long as we can treat her osteoporosis. She has already completed 3 years.  2. History of subsegmental pulmonary embolism in October 2019.  She treated with enoxaparin, then rivaroxaban.  Anticoagulation was discontinued in February 2020. She does not have symptoms suggestive of recurrence.  3. Osteoporosis, for which she is receiving Prolia injections every 6 months as of May 2020.  She had significant bone pain associated with this, but started Claritin and did not have as much bone pain with her next injection.    4. History of COVID 19 in November.  I think her persistent cough could still be residual effects from this and agree with treating symptomatically.  5. Fibrocystic changes of the breasts which is tender.  We discussed this and I answered her questions.  Plan: She continues to do well. She will continue with anastrozole and prolia. We discussed her bone density results again. We will see her back in clinic in 6 months with CBC and CMP for repeat evaluation and Prolia injection. She will then be due for her annual bilateral mammogram at the end of August.  She verbalizes understanding of and agreement to the plans discussed today. She knows to call the office should any new questions or concerns arise.       Derwood Kaplan, MD

## 2021-03-05 ENCOUNTER — Inpatient Hospital Stay: Payer: PPO

## 2021-03-05 VITALS — BP 114/77 | HR 98 | Temp 97.8°F | Resp 17 | Wt 167.0 lb

## 2021-03-05 DIAGNOSIS — M810 Age-related osteoporosis without current pathological fracture: Secondary | ICD-10-CM

## 2021-03-05 DIAGNOSIS — C50211 Malignant neoplasm of upper-inner quadrant of right female breast: Secondary | ICD-10-CM | POA: Diagnosis not present

## 2021-03-05 MED ORDER — DENOSUMAB 60 MG/ML ~~LOC~~ SOSY
60.0000 mg | PREFILLED_SYRINGE | Freq: Once | SUBCUTANEOUS | Status: AC
  Administered 2021-03-05: 60 mg via SUBCUTANEOUS
  Filled 2021-03-05: qty 1

## 2021-03-05 NOTE — Progress Notes (Signed)
Patient tolerated Prolia injection well today, no concerns voiced. Patient discharged, stable.

## 2021-03-05 NOTE — Patient Instructions (Signed)
Denosumab injection What is this medication? DENOSUMAB (den oh sue mab) slows bone breakdown. Prolia is used to treat osteoporosis in women after menopause and in men, and in people who are taking corticosteroids for 6 months or more. Delton See is used to treat a high calcium level due to cancer and to prevent bone fractures and other bone problems caused by multiple myeloma or cancer bone metastases. Delton See is also used to treat giant cell tumor of the bone. This medicine may be used for other purposes; ask your health care provider or pharmacist if you have questions. COMMON BRAND NAME(S): Prolia, XGEVA What should I tell my care team before I take this medication? They need to know if you have any of these conditions: dental disease having surgery or tooth extraction infection kidney disease low levels of calcium or Vitamin D in the blood malnutrition on hemodialysis skin conditions or sensitivity thyroid or parathyroid disease an unusual reaction to denosumab, other medicines, foods, dyes, or preservatives pregnant or trying to get pregnant breast-feeding How should I use this medication? This medicine is for injection under the skin. It is given by a health care professional in a hospital or clinic setting. A special MedGuide will be given to you before each treatment. Be sure to read this information carefully each time. For Prolia, talk to your pediatrician regarding the use of this medicine in children. Special care may be needed. For Delton See, talk to your pediatrician regarding the use of this medicine in children. While this drug may be prescribed for children as young as 13 years for selected conditions, precautions do apply. Overdosage: If you think you have taken too much of this medicine contact a poison control center or emergency room at once. NOTE: This medicine is only for you. Do not share this medicine with others. What if I miss a dose? It is important not to miss your dose.  Call your doctor or health care professional if you are unable to keep an appointment. What may interact with this medication? Do not take this medicine with any of the following medications: other medicines containing denosumab This medicine may also interact with the following medications: medicines that lower your chance of fighting infection steroid medicines like prednisone or cortisone This list may not describe all possible interactions. Give your health care provider a list of all the medicines, herbs, non-prescription drugs, or dietary supplements you use. Also tell them if you smoke, drink alcohol, or use illegal drugs. Some items may interact with your medicine. What should I watch for while using this medication? Visit your doctor or health care professional for regular checks on your progress. Your doctor or health care professional may order blood tests and other tests to see how you are doing. Call your doctor or health care professional for advice if you get a fever, chills or sore throat, or other symptoms of a cold or flu. Do not treat yourself. This drug may decrease your body's ability to fight infection. Try to avoid being around people who are sick. You should make sure you get enough calcium and vitamin D while you are taking this medicine, unless your doctor tells you not to. Discuss the foods you eat and the vitamins you take with your health care professional. See your dentist regularly. Brush and floss your teeth as directed. Before you have any dental work done, tell your dentist you are receiving this medicine. Do not become pregnant while taking this medicine or for 5 months after  stopping it. Talk with your doctor or health care professional about your birth control options while taking this medicine. Women should inform their doctor if they wish to become pregnant or think they might be pregnant. There is a potential for serious side effects to an unborn child. Talk to  your health care professional or pharmacist for more information. What side effects may I notice from receiving this medication? Side effects that you should report to your doctor or health care professional as soon as possible: allergic reactions like skin rash, itching or hives, swelling of the face, lips, or tongue bone pain breathing problems dizziness jaw pain, especially after dental work redness, blistering, peeling of the skin signs and symptoms of infection like fever or chills; cough; sore throat; pain or trouble passing urine signs of low calcium like fast heartbeat, muscle cramps or muscle pain; pain, tingling, numbness in the hands or feet; seizures unusual bleeding or bruising unusually weak or tired Side effects that usually do not require medical attention (report to your doctor or health care professional if they continue or are bothersome): constipation diarrhea headache joint pain loss of appetite muscle pain runny nose tiredness upset stomach This list may not describe all possible side effects. Call your doctor for medical advice about side effects. You may report side effects to FDA at 1-800-FDA-1088. Where should I keep my medication? This medicine is only given in a clinic, doctor's office, or other health care setting and will not be stored at home. NOTE: This sheet is a summary. It may not cover all possible information. If you have questions about this medicine, talk to your doctor, pharmacist, or health care provider.  2022 [employer] (2017-06-24 00:00:00)

## 2021-03-09 ENCOUNTER — Encounter: Payer: Self-pay | Admitting: Oncology

## 2021-03-13 ENCOUNTER — Encounter: Payer: Self-pay | Admitting: Hematology and Oncology

## 2021-03-17 DIAGNOSIS — J40 Bronchitis, not specified as acute or chronic: Secondary | ICD-10-CM | POA: Diagnosis not present

## 2021-03-17 DIAGNOSIS — J329 Chronic sinusitis, unspecified: Secondary | ICD-10-CM | POA: Diagnosis not present

## 2021-04-21 ENCOUNTER — Other Ambulatory Visit: Payer: Self-pay | Admitting: Hematology and Oncology

## 2021-04-21 MED ORDER — ANASTROZOLE 1 MG PO TABS: 1.0000 mg | ORAL_TABLET | Freq: Every day | ORAL | 3 refills | Status: DC

## 2021-05-13 DIAGNOSIS — J40 Bronchitis, not specified as acute or chronic: Secondary | ICD-10-CM | POA: Diagnosis not present

## 2021-05-13 DIAGNOSIS — M461 Sacroiliitis, not elsewhere classified: Secondary | ICD-10-CM | POA: Diagnosis not present

## 2021-05-13 DIAGNOSIS — F430 Acute stress reaction: Secondary | ICD-10-CM | POA: Diagnosis not present

## 2021-05-13 DIAGNOSIS — S46812A Strain of other muscles, fascia and tendons at shoulder and upper arm level, left arm, initial encounter: Secondary | ICD-10-CM | POA: Diagnosis not present

## 2021-05-13 DIAGNOSIS — E669 Obesity, unspecified: Secondary | ICD-10-CM | POA: Diagnosis not present

## 2021-05-13 DIAGNOSIS — E1129 Type 2 diabetes mellitus with other diabetic kidney complication: Secondary | ICD-10-CM | POA: Diagnosis not present

## 2021-05-13 DIAGNOSIS — J329 Chronic sinusitis, unspecified: Secondary | ICD-10-CM | POA: Diagnosis not present

## 2021-05-13 DIAGNOSIS — Z6833 Body mass index (BMI) 33.0-33.9, adult: Secondary | ICD-10-CM | POA: Diagnosis not present

## 2021-05-13 DIAGNOSIS — C50911 Malignant neoplasm of unspecified site of right female breast: Secondary | ICD-10-CM | POA: Diagnosis not present

## 2021-05-13 DIAGNOSIS — N1831 Chronic kidney disease, stage 3a: Secondary | ICD-10-CM | POA: Diagnosis not present

## 2021-05-13 DIAGNOSIS — F331 Major depressive disorder, recurrent, moderate: Secondary | ICD-10-CM | POA: Diagnosis not present

## 2021-06-22 ENCOUNTER — Encounter: Payer: Self-pay | Admitting: Podiatry

## 2021-06-22 ENCOUNTER — Ambulatory Visit (INDEPENDENT_AMBULATORY_CARE_PROVIDER_SITE_OTHER): Payer: PPO

## 2021-06-22 ENCOUNTER — Ambulatory Visit (INDEPENDENT_AMBULATORY_CARE_PROVIDER_SITE_OTHER): Payer: PPO | Admitting: Podiatry

## 2021-06-22 DIAGNOSIS — M722 Plantar fascial fibromatosis: Secondary | ICD-10-CM | POA: Diagnosis not present

## 2021-06-22 DIAGNOSIS — M79672 Pain in left foot: Secondary | ICD-10-CM

## 2021-06-22 MED ORDER — TRIAMCINOLONE ACETONIDE 10 MG/ML IJ SUSP
10.0000 mg | Freq: Once | INTRAMUSCULAR | Status: AC
  Administered 2021-06-22: 10 mg

## 2021-06-22 NOTE — Progress Notes (Signed)
Subjective:  ? ?Patient ID: Sheena Joyce, female   DOB: 76 y.o.   MRN: [id number]  ? ?HPI ?Patient states she has had severe discomfort plantar aspect left heel that is been going on about 5 months and she was much more active on her foot as her husband had passed away.  Patient does not smoke likes to be active ? ? ?Review of Systems  ?All other systems reviewed and are negative. ? ? ?   ?Objective:  ?Physical Exam ?Vitals and nursing note reviewed.  ?Constitutional:   ?   Appearance: She is well-developed.  ?Pulmonary:  ?   Effort: Pulmonary effort is normal.  ?Musculoskeletal:     ?   General: Normal range of motion.  ?Skin: ?   General: Skin is warm.  ?Neurological:  ?   Mental Status: She is alert.  ?  ?Neurovascular status intact muscle strength found to be within normal limits with patient found to have exquisite discomfort plantar aspect left heel at the insertional point tendon calcaneus inflammation fluid around the medial band good digital perfusion moderate depression of the arch noted ? ?   ?Assessment:  ?Cute plantar fasciitis left inflammation fluid around the medial band with mechanical dysfunction ? ?   ?Plan:  ?H&P reviewed condition and went ahead today did sterile prep injected the plantar fascia left 3 mg Kenalog 5 mg Xylocaine and applied fascial brace to lift up the arch and take stress off the insertion of the fascia to the calcaneus.  Patient also will do shoe gear modifications physical therapy instructions which were given and was instructed on reduced activity.  Reappoint 2 weeks or earlier if needed ? ?X-rays indicate small spur no indication stress fracture arthritis associated with condition ?   ? ? ?

## 2021-06-22 NOTE — Patient Instructions (Signed)

## 2021-06-29 ENCOUNTER — Other Ambulatory Visit: Payer: Self-pay | Admitting: Podiatry

## 2021-06-29 DIAGNOSIS — M722 Plantar fascial fibromatosis: Secondary | ICD-10-CM

## 2021-07-06 ENCOUNTER — Ambulatory Visit: Payer: PPO | Admitting: Podiatry

## 2021-07-15 ENCOUNTER — Ambulatory Visit (INDEPENDENT_AMBULATORY_CARE_PROVIDER_SITE_OTHER): Payer: PPO | Admitting: Podiatry

## 2021-07-15 DIAGNOSIS — M722 Plantar fascial fibromatosis: Secondary | ICD-10-CM | POA: Diagnosis not present

## 2021-07-15 MED ORDER — TRIAMCINOLONE ACETONIDE 10 MG/ML IJ SUSP
10.0000 mg | Freq: Once | INTRAMUSCULAR | Status: AC
  Administered 2021-07-15: 10 mg

## 2021-07-15 NOTE — Progress Notes (Signed)
Subjective:   Patient ID: Sheena Joyce, female   DOB: 76 y.o.   MRN: [id number]   HPI Patient presents stating that the bottom of the left heel is still bothering her and worse when she gets up in the morning after periods of sitting but it seems like it is more on the outside and on the inside   ROS      Objective:  Physical Exam  Neurovascular status intact with patient is having continued problems with the left plantar fascia and it did not respond to my initial treatment plan     Assessment:  Chronic Planter fasciitis left which may more involve the lateral side which could be compensatory and requires significant increase in the stretch mechanism for this in order to get it better     Plan:  Reviewed condition and went ahead and from the lateral side did fascial injection 3 mg Kenalog 5 mg Xylocaine then I dispensed a night splint that I want her to use 2-3 times a day for 30-minute periods and sleep and it and do ice therapy during the times she uses it during the day.  She will be seen back if symptoms continue

## 2021-07-28 DIAGNOSIS — H93A2 Pulsatile tinnitus, left ear: Secondary | ICD-10-CM | POA: Diagnosis not present

## 2021-07-28 DIAGNOSIS — E669 Obesity, unspecified: Secondary | ICD-10-CM | POA: Diagnosis not present

## 2021-07-28 DIAGNOSIS — E1129 Type 2 diabetes mellitus with other diabetic kidney complication: Secondary | ICD-10-CM | POA: Diagnosis not present

## 2021-07-28 DIAGNOSIS — M4722 Other spondylosis with radiculopathy, cervical region: Secondary | ICD-10-CM | POA: Diagnosis not present

## 2021-07-28 DIAGNOSIS — Z9181 History of falling: Secondary | ICD-10-CM | POA: Diagnosis not present

## 2021-07-28 DIAGNOSIS — Z6832 Body mass index (BMI) 32.0-32.9, adult: Secondary | ICD-10-CM | POA: Diagnosis not present

## 2021-07-31 DIAGNOSIS — R519 Headache, unspecified: Secondary | ICD-10-CM | POA: Diagnosis not present

## 2021-07-31 DIAGNOSIS — H93A2 Pulsatile tinnitus, left ear: Secondary | ICD-10-CM | POA: Diagnosis not present

## 2021-07-31 DIAGNOSIS — M4312 Spondylolisthesis, cervical region: Secondary | ICD-10-CM | POA: Diagnosis not present

## 2021-07-31 DIAGNOSIS — M47812 Spondylosis without myelopathy or radiculopathy, cervical region: Secondary | ICD-10-CM | POA: Diagnosis not present

## 2021-07-31 DIAGNOSIS — M4722 Other spondylosis with radiculopathy, cervical region: Secondary | ICD-10-CM | POA: Diagnosis not present

## 2021-08-06 DIAGNOSIS — M5481 Occipital neuralgia: Secondary | ICD-10-CM | POA: Diagnosis not present

## 2021-08-12 ENCOUNTER — Ambulatory Visit: Payer: PPO | Admitting: Podiatry

## 2021-08-18 ENCOUNTER — Other Ambulatory Visit: Payer: Self-pay | Admitting: Hematology and Oncology

## 2021-08-18 MED ORDER — ANASTROZOLE 1 MG PO TABS: 1.0000 mg | ORAL_TABLET | Freq: Every day | ORAL | 3 refills | Status: DC

## 2021-08-20 DIAGNOSIS — R531 Weakness: Secondary | ICD-10-CM | POA: Diagnosis not present

## 2021-08-20 DIAGNOSIS — M542 Cervicalgia: Secondary | ICD-10-CM | POA: Diagnosis not present

## 2021-08-20 DIAGNOSIS — M2560 Stiffness of unspecified joint, not elsewhere classified: Secondary | ICD-10-CM | POA: Diagnosis not present

## 2021-08-24 DIAGNOSIS — R531 Weakness: Secondary | ICD-10-CM | POA: Diagnosis not present

## 2021-08-24 DIAGNOSIS — M542 Cervicalgia: Secondary | ICD-10-CM | POA: Diagnosis not present

## 2021-08-24 DIAGNOSIS — M2560 Stiffness of unspecified joint, not elsewhere classified: Secondary | ICD-10-CM | POA: Diagnosis not present

## 2021-08-28 DIAGNOSIS — M2560 Stiffness of unspecified joint, not elsewhere classified: Secondary | ICD-10-CM | POA: Diagnosis not present

## 2021-08-28 DIAGNOSIS — R531 Weakness: Secondary | ICD-10-CM | POA: Diagnosis not present

## 2021-08-28 DIAGNOSIS — M542 Cervicalgia: Secondary | ICD-10-CM | POA: Diagnosis not present

## 2021-09-01 NOTE — Progress Notes (Signed)
[facility]  [address] [place],  [place]  [address] [phone]  Clinic Day: 09/02/21  Referring physician: Emmaline Kluver, *   CHIEF COMPLAINT:  CC: A 76 year old woman with stage IIB (T2 N78mc M0) hormone positive breast cancer diagnosed in July 2019 here for 4 month evaluation  Current Treatment:  Anastrazole   HISTORY OF PRESENT ILLNESS:  Sheena TRUXILLOis a 76y.o. female with a history of stage IIB (T2 N125m M0) hormone positive breast cancer diagnosed in July 2019.  Her history dates back to May 2019 when a breast cancer was discovered on a screening mammogram, which revealed a possible mass in the right breast.  Diagnostic right mammogram revealed a mass in the superior central right breast, which measure 1.3 cm on ultrasound.  There was also a second similar mass measuring 1.1 cm, as well as borderline cortical thickening of a single right axilla.  Biopsies of both lesions were done.  The first revealed a grade 1, invasive lobular carcinoma along with lobular carcinoma in situ and the other biopsy site revealed a 5 mm focus of proliferative fibrocystic changes.  Estrogen and progesterone receptors were highly positive and HER 2 negative.  Ki 67 was 1%.  MRI  breast confirmed multifocal disease with adjacent enhancing masses in the upper inner quadrant of the right breast.  No other malignancy was found in either breast and no pathologic lymphadenopathy was seen.  She was treated with right breast lumpectomy in August 2019.  Pathology revealed a 4.5 cm, grade 2, invasive lobular carcinoma with lobular carcinoma in situ.  Two lymph nodes had micrometastases and 2 lymph nodes were negative for metastasis.  Margins were clear.  No lymphovascular or perineural invasion was seen.  EndoPredict testing revealed a high risk recurrence score.  Due to her personal and family history of malignancy, she underwent testing for hereditary cancer syndromes with the  Myriad myRisk hereditary cancer panel test.  This did not reveal any clinically significant mutation or variant of uncertain significance.  She was recommended for adjuvant chemotherapy with docetaxel/cyclophosphamide due to her high risk EndoPredict score.  She had difficulty tolerating treatment due to severe bone pain and headaches.  She then developed a subsegmental right lower lobe pulmonary embolism in October 2019.  She was initially treated with enoxaparin 1 milligram/kilogram twice daily for 1 month, then 1.5 milligrams/kilogram daily.  She was switched to rivaroxaban in December.  She completed 4 cycles of docetaxel/cyclophosphamide in December 2019. Due to toxicities, the chemotherapy doses were decreased by 20% with her 2nd cycle.  She required IV fluid support and pain medication throughout chemotherapy.   Bone density scan in February 2020 revealed osteoporosis, with a T-score of -2.2 of the spine for osteopenia and -2.6 for the right femur.  We really do not have a comparison study since 2012 and she had osteoporosis at that time, so this not a new problem, but has gone untreated.  She did not tolerate alendronate previously.  She is on calcium with vitamin-D 1500 mg daily.  She was started on anastrozole 1 mg daily at the end of January 2020.  Since completion of chemotherapy, the patient has complained of cough.  We recommended treatment of the osteoporosis with Prolia and she started that in May 2020 and had an increase in bone pain.  She continued to complain of bone pain in August.  She was seen in November 2020 prior to 2nd dose of Prolia,  at which time she reported back pain.  MRI through her orthopedic office apparently revealed degenerative disc changes.  She also reported chronic nonproductive cough.  We recommended she continue pantoprazole 40 mg daily, as we felt acid reflux could be contributing to her cough.Annual bilateral mammogram in August of 2022 was clear.  She had COVID-19 in  November 2022.  A bone density scan in June 2022 still shows osteoporosis of the femur with a T score stable at -2.6.  The spine shows improvement from a T score of -2.2 down to -1.4.  INTERVAL HISTORY:  Sheena Joyce is here for 4 month evaluation. She continues her anastrazole sine January of 2020 without significant difficulty.  Her husband died in 05-04-21 and so she is still grieving.  They both ended up with COVID-19 in November 2022 and he did not recover and was transferred to [place] home in December and continued to do poorly.  CBC and CMP are normal.  She is now on Voltaren 75 mg twice daily as needed for her arthritis.  She does get head and neck pain occasionally with nausea and so had an MRI of the cervical spine in June which does show multilevel cervical spondylosis with mild to moderate spinal stenosis at C3-4 through C5-6.  She has multifactorial degenerative changes with multilevel foraminal narrowing and reactive marrow edema at the L at the left C4-5 facet.  An MRA of the head showed normal intracranial circulation.  She is seeing Dr. Sherley Bounds and is scheduled for an injection later this month.  Her last mammogram was October 23, 2020 and so she will be due next month.  She denies fever, chills, abdominal pain or vomiting.She denies shortness of breath, chest pain or cough. She denies issue with bowel or bladder. She continues with prolia injections every 6 months, and is due at 2 PM today. REVIEW OF SYSTEMS:  Review of Systems  Constitutional: Negative.  Negative for appetite change, chills, diaphoresis, fatigue, fever and unexpected weight change.  HENT:  Negative.  Negative for hearing loss, lump/mass, mouth sores, nosebleeds, sore throat, tinnitus, trouble swallowing and voice change.   Eyes: Negative.  Negative for eye problems and icterus.  Respiratory: Negative.  Negative for chest tightness, cough, hemoptysis, shortness of breath and wheezing.   Cardiovascular:  Negative.  Negative for chest pain, leg swelling and palpitations.  Gastrointestinal: Negative.  Negative for abdominal distention, abdominal pain, blood in stool, constipation, diarrhea, nausea, rectal pain and vomiting.  Endocrine: Negative.  Negative for hot flashes.  Genitourinary: Negative.  Negative for bladder incontinence, difficulty urinating, dyspareunia, dysuria, frequency, hematuria and nocturia.   Musculoskeletal: Negative.  Negative for arthralgias, back pain, flank pain, gait problem, myalgias, neck pain and neck stiffness.  Skin: Negative.  Negative for itching, rash and wound.  Neurological: Negative.  Negative for dizziness, extremity weakness, gait problem, headaches, light-headedness, numbness, seizures and speech difficulty.  Hematological: Negative.  Negative for adenopathy. Does not bruise/bleed easily.  Psychiatric/Behavioral: Negative.  Negative for confusion, decreased concentration, depression, sleep disturbance and suicidal ideas. The patient is not nervous/anxious.   All other systems reviewed and are negative.    VITALS:  Blood pressure 138/73, pulse 75, temperature 98.1 F (36.7 C), temperature source Oral, resp. rate 18, height 4' 11"  (1.499 m), weight 167 lb 11.2 oz (76.1 kg), SpO2 95 %.  Wt Readings from Last 3 Encounters:  09/03/21 167 lb (75.8 kg)  09/02/21 167 lb 11.2 oz (76.1 kg)  03/05/21 167 lb (75.8  kg)    Body mass index is 33.87 kg/m.  Performance status (ECOG): 1 - Symptomatic but completely ambulatory  PHYSICAL EXAM:  Physical Exam Constitutional:      General: She is not in acute distress.    Appearance: Normal appearance. She is normal weight. She is not ill-appearing, toxic-appearing or diaphoretic.  HENT:     Head: Normocephalic and atraumatic.     Right Ear: Tympanic membrane, ear canal and external ear normal. There is no impacted cerumen.     Left Ear: Tympanic membrane, ear canal and external ear normal. There is no impacted  cerumen.     Nose: Nose normal. No congestion or rhinorrhea.     Mouth/Throat:     Mouth: Mucous membranes are moist.     Pharynx: No oropharyngeal exudate or posterior oropharyngeal erythema.  Eyes:     General: No scleral icterus.       Right eye: No discharge.        Left eye: No discharge.     Extraocular Movements: Extraocular movements intact.     Conjunctiva/sclera: Conjunctivae normal.     Pupils: Pupils are equal, round, and reactive to light.  Neck:     Vascular: No carotid bruit.  Cardiovascular:     Rate and Rhythm: Normal rate and regular rhythm.     Pulses: Normal pulses.     Heart sounds: No murmur heard.    No friction rub. No gallop.  Pulmonary:     Effort: Pulmonary effort is normal. No respiratory distress.     Breath sounds: Normal breath sounds. No stridor. No wheezing, rhonchi or rales.  Chest:     Chest wall: No mass, lacerations, deformity, swelling, tenderness, crepitus or edema. There is no dullness to percussion.  Breasts:    Breasts are symmetrical.     Right: Normal. No swelling, bleeding, inverted nipple, mass, nipple discharge, skin change or tenderness.     Left: Normal. No swelling, bleeding, inverted nipple, mass, nipple discharge, skin change or tenderness.  Abdominal:     General: Abdomen is flat. Bowel sounds are normal. There is no distension.     Palpations: Abdomen is soft. There is no mass.     Tenderness: There is no abdominal tenderness. There is no right CVA tenderness, left CVA tenderness, guarding or rebound.     Hernia: No hernia is present.  Musculoskeletal:        General: No swelling, tenderness, deformity or signs of injury. Normal range of motion.     Cervical back: Normal range of motion and neck supple. No rigidity or tenderness.     Right lower leg: No edema.     Left lower leg: No edema.  Lymphadenopathy:     Cervical: No cervical adenopathy.     Upper Body:     Right upper body: No supraclavicular, axillary or pectoral  adenopathy.     Left upper body: No supraclavicular, axillary or pectoral adenopathy.  Skin:    General: Skin is warm and dry.     Capillary Refill: Capillary refill takes less than 2 seconds.     Coloration: Skin is not jaundiced or pale.     Findings: No bruising, erythema, lesion or rash.  Neurological:     General: No focal deficit present.     Mental Status: She is alert and oriented to person, place, and time. Mental status is at baseline.     Cranial Nerves: No cranial nerve deficit.  Sensory: No sensory deficit.     Motor: No weakness.     Coordination: Coordination normal.     Gait: Gait normal.     Deep Tendon Reflexes: Reflexes normal.  Psychiatric:        Mood and Affect: Mood normal.        Behavior: Behavior normal.        Thought Content: Thought content normal.        Judgment: Judgment normal.    Lymph nodes:   There is no cervical, clavicular, axillary or inguinal lymphadenopathy.   LABS:      Latest Ref Rng & Units 09/02/2021   12:00 AM 03/04/2021   12:00 AM 08/04/2020   12:00 AM  CBC  WBC  6.6     7.6     10.1   Hemoglobin 12.0 - 16.0 12.6     12.9     14.1   Hematocrit 36 - 46 39     40     43   Platelets 150 - 400 K/uL 267     282     272      This result is from an external source.      Latest Ref Rng & Units 09/02/2021   12:00 AM 03/04/2021   12:00 AM 08/04/2020   12:00 AM  CMP  BUN 4 - 21 18     12     17    Creatinine 0.5 - 1.1 0.8     1.0     0.8   Sodium 137 - 147 136     138     135   Potassium 3.5 - 5.1 mEq/L 4.2     4.1     4.2   Chloride 99 - 108 104     104     101   CO2 13 - 22 24     25     25    Calcium 8.7 - 10.7 9.5     9.4     9.4   Alkaline Phos 25 - 125 65     67     74   AST 13 - 35 22     29     27    ALT 7 - 35 U/L 17     20     19       This result is from an external source.     No results found for: "CEA1", "CEA" / No results found for: "CEA1", "CEA" No results found for: "PSA1" No results found for: "TUU828" No  results found for: "CAN125"  No results found for: "TOTALPROTELP", "ALBUMINELP", "A1GS", "A2GS", "BETS", "BETA2SER", "GAMS", "MSPIKE", "SPEI" No results found for: "TIBC", "FERRITIN", "IRONPCTSAT" No results found for: "LDH"  STUDIES:  Exam(s): [id number] DEXA/DG DEXA EXAM: DUAL X-RAY ABSORPTIOMETRY (DXA) FOR BONE MINERAL DENSITY  IMPRESSION: [place] completed a BMD test on 08/01/2020 using the [place] (analysis version: 13.60) manufactured by [employer]. The following summarizes the results of our evaluation. Technologist: [place]  PATIENT BIOGRAPHICAL: Name: Sheena Joyce, Sheena Joyce Patient ID:  [id number] [place] Birth Date: 1945-11-18 Height:     60.0 in. Gender: Female Exam Date: 08/01/2020 Weight: 171.0 lbs. Indications:  m85.89      Fractures:            Treatments:  ASSESSMENT: The BMD measured at Femur Neck Left is 0.670 g/cm2 with a T-score of -2.6. This patient is considered osteoporotic according to [facility] (  WHO) criteria. The scan quality is good. Patient currently takes a bone building therapy- Prolia  Site Region Measured Measured WHO Young Adult BMD Date     Age      Classification T-score AP Spine L1-L4 7/01/2020 76.1 Osteopenia -1.4 1.013 g/cm2 AP Spine L1-L4 04/03/2018 73.8 Osteopenia -2.2 0.913 g/cm2  DualFemur Neck Left 08/01/2020 76.2 Osteoporosis -2.6 0.670 g/cm2 DualFemur Neck Left 04/03/2018 73.8 Osteoporosis -2.5 0.686 g/cm2  DualFemur Total Mean 08/01/2020 76.2 Osteopenia -2.0 0.756 g/cm2 DualFemur Total Mean 04/03/2018 73.8 Osteopenia -2.0 0.752 g/cm2  World Health Organization [facility]) criteria for post-menopausal, Caucasian Women: Normal:       T-score at or above -1 SD Osteopenia:   T-score between -1 and -2.5 SD Osteoporosis: T-score at or below -2.5 SD  RECOMMENDATIONS: 1. All patients should optimize calcium and vitamin D intake. 2. Consider FDA- approved medical therapies in post menopausal women and men aged 20 years and older, based on  the following: a. A hip or vertebral (clinical or morphometric) fracture. b. T- score < 2.5 of the femoral neck or spine after appropriate evaluation to exclude secondary causes. c. Low bone mass (T- score between -1.0 and -2.5 at femoral neck or spine) and a 10 -year probability of a hip fracture > 3% or a 10 -year probability of a major osteoporosis- related fracture > 20% based on the [place]- adapted WHO algorithm. d. Clinician judgement and/ or patient preferences may indicate treatment for people with 10- year fracture probabilities above or below these levels.  FOLLOW-UP: People with diagnosed cases of osteoporosis or at high risk for fracture should have regular bone mineral density tests. For patients eligible for Medicare, routine testing is allowed once every 2 years. The testing frequency can be increased to one year for patients who have rapidly progressing disease, those who are receiving medical therapy to restore bone mass, or have additional risk factors. I have reviewed this report and agree with the above findings.  Mark A. Thornton Papas, M.D.  [facility]   Electronically Signed   By: Lavonia Dana M.D.   On: 08/01/2020 12:39  Electronically Signed By: Burnetta Sabin MD  Electronically Signed Date/Time: 06/03/221241 Dictate Date/Time: 08/01/20 1238  Technologist: Dannette Barbara J Transcribed By: Odie Sera Transcribed Date/Time: 08/01/20 1239   HISTORY:   Past Medical History:  Diagnosis Date   Anxiety    Arthritis    Cancer of overlapping sites of right female breast ([place]) 1/95/0932   Complication of anesthesia    "became unresposive after surgery"- 2017 , aspirated after surgery and had pneumonia- 2017    Depression    Diabetes mellitus without complication ([place])    "on the borderline"   Diverticulitis    Family history of adverse reaction to anesthesia    mother slow to wake up afgter 5 days, sister had port placed and had issues - went into coma but  was very sick    GERD (gastroesophageal reflux disease)    Headache    Pneumonia    PONV (postoperative nausea and vomiting)    Prolapsed uterus    and bladder    Past Surgical History:  Procedure Laterality Date   BREAST LUMPECTOMY WITH RADIOACTIVE SEED AND SENTINEL LYMPH NODE BIOPSY Right 10/19/2017   Procedure: INJECT BLUE DYE RIGHT BREAST AND RIGHT BREAST LUMPECTOMY WITH TWO RADIOACTIVE SEEDS AND RIGHT AXILLARY SENTINEL LYMPH NODE BIOPSY;  Surgeon: [name], MD;  Location: [facility];  Service: General;  Laterality: Right;   COLON SURGERY  HERNIA REPAIR     PORTACATH PLACEMENT Left 12/07/2017   Procedure: INSERTION PORT-A-CATH WITH ULTRA SOUND;  Surgeon: [name], MD;  Location: [facility];  Service: General;  Laterality: Left;    Family History  Problem Relation Age of Onset   Osteoporosis Mother    Heart disease Father     Social History:  reports that she has never smoked. She has never used smokeless tobacco. She reports that she does not drink alcohol and does not use drugs.The patient is alone  today.  Allergies:  Allergies  Allergen Reactions   Other     Other reaction(s): Other (See Comments) Pt states medication after sx made her "unresponsive", "sounds like oxycodone but can't remember the name"   Codeine Rash   Hydrocodone Rash    Current Medications: Current Outpatient Medications  Medication Sig Dispense Refill   anastrozole (ARIMIDEX) 1 MG tablet Take 1 tablet (1 mg total) by mouth daily. 90 tablet 3   Calcium-Vitamin D-Vitamin K 650-12.5-40 MG-MCG-MCG CHEW Chew 1,300 mg by mouth daily.     diclofenac (VOLTAREN) 75 MG EC tablet Take 75 mg by mouth 2 (two) times daily.     dicyclomine (BENTYL) 20 MG tablet Take 20 mg by mouth 4 (four) times daily.     LAGEVRIO 200 MG CAPS capsule Take by mouth.     meloxicam (MOBIC) 15 MG tablet Take 1 tablet by mouth daily.     Multiple Vitamin (MULTI-VITAMIN) tablet Take 2 tablets by mouth daily.     ondansetron  (ZOFRAN) 4 MG tablet Take 1 tablet by mouth every 4 (four) hours as needed.  2   pantoprazole (PROTONIX) 40 MG tablet Take 1 tablet by mouth daily.     polyethylene glycol (MIRALAX / GLYCOLAX) 17 g packet Take 17 g by mouth daily.     promethazine (PHENERGAN) 12.5 MG tablet Take by mouth.     propranolol ER (INDERAL LA) 80 MG 24 hr capsule Take 80 mg by mouth daily.     rizatriptan (MAXALT) 10 MG tablet Take 5-10 mg by mouth 2 (two) times daily as needed for migraine. May repeat in 2 hours if needed     simvastatin (ZOCOR) 20 MG tablet Take 20 mg by mouth at bedtime.     simvastatin (ZOCOR) 40 MG tablet Take 40 mg by mouth daily. (Patient not taking: Reported on 03/04/2021)     tiZANidine (ZANAFLEX) 4 MG capsule Take 4 mg by mouth 3 (three) times daily.     traMADol-acetaminophen (ULTRACET) 37.5-325 MG tablet Take by mouth.     traZODone (DESYREL) 50 MG tablet TAKE 1 TABLET BY MOUTH AT BEDTIME 90 tablet 2   venlafaxine XR (EFFEXOR-XR) 75 MG 24 hr capsule Take 75 mg by mouth daily with breakfast.     No current facility-administered medications for this visit.     ASSESSMENT & PLAN:   Assessment:  Sheena Joyce is a 76 y.o. female with:  1. Stage IIIB breast cancer.  She remains without evidence of recurrence, now 4 years out.  She will continue anastrozole for least a total of 5 years, as long as we can treat her osteoporosis. She has already completed 3 and a half years.  However I pulled out her original Endo predict report from September of 2019.  This was high risk with an app Clin score of 3.8 for a 15% risk of distant recurrence in the next 10 years.  Her absolute chemotherapy benefit was 6%.  Her  likelihood of a late distant recurrence in years 5-15 was 12%, which I consider fairly high.  We will therefore continue consider treating her with extended adjuvant hormonal therapy for 7 to 10 years total if we can control her osteoporosis and she has no other toxicities.  2. History of  subsegmental pulmonary embolism in October 2019.  She treated with enoxaparin, then rivaroxaban.  Anticoagulation was discontinued in February 2020. She does not have symptoms suggestive of recurrence.  3. Osteoporosis, for which she is receiving Prolia injections every 6 months as of May 2020.  She had significant bone pain associated with this, but started Claritin and did not have as much bone pain with her next injection.    4. History of COVID 19 in November.  I think her persistent cough could still be residual effects from this and agree with treating symptomatically.  5. Fibrocystic changes of the breasts which is tender.  We discussed this and I answered her questions.  Plan: She continues to do well but is grieving appropriately as expected since she lost her husband in February.  I pulled out her Endo predict score and I feel she would benefit from extended adjuvant hormonal therapy due to her high risk of recurrence and significant risk of late distant recurrence.  I will schedule her bilateral screening mammogram for the end of August and call her with that result.  She will be due for her next bone density scan in 1 year.  She will have her Prolia injection today.  She will continue with anastrozole daily and prolia every 6 months.  We will see her back in clinic in 6 months with CBC and CMP for repeat evaluation and Prolia injection.   She verbalizes understanding of and agreement to the plans discussed today. She knows to call the office should any new questions or concerns arise.       Derwood Kaplan, MD

## 2021-09-02 ENCOUNTER — Inpatient Hospital Stay: Payer: PPO

## 2021-09-02 ENCOUNTER — Other Ambulatory Visit: Payer: Self-pay | Admitting: Oncology

## 2021-09-02 ENCOUNTER — Inpatient Hospital Stay: Payer: PPO | Attending: Oncology | Admitting: Oncology

## 2021-09-02 ENCOUNTER — Encounter: Payer: Self-pay | Admitting: Oncology

## 2021-09-02 VITALS — BP 138/73 | HR 75 | Temp 98.1°F | Resp 18 | Ht 59.0 in | Wt 167.7 lb

## 2021-09-02 DIAGNOSIS — Z170 Estrogen receptor positive status [ER+]: Secondary | ICD-10-CM

## 2021-09-02 DIAGNOSIS — M810 Age-related osteoporosis without current pathological fracture: Secondary | ICD-10-CM | POA: Diagnosis not present

## 2021-09-02 DIAGNOSIS — C50811 Malignant neoplasm of overlapping sites of right female breast: Secondary | ICD-10-CM

## 2021-09-02 LAB — BASIC METABOLIC PANEL
BUN: 18 (ref 4–21)
CO2: 24 — AB (ref 13–22)
Chloride: 104 (ref 99–108)
Creatinine: 0.8 (ref 0.5–1.1)
Glucose: 444
Potassium: 4.2 mEq/L (ref 3.5–5.1)
Sodium: 136 — AB (ref 137–147)

## 2021-09-02 LAB — COMPREHENSIVE METABOLIC PANEL
Albumin: 4.1 (ref 3.5–5.0)
Calcium: 9.5 (ref 8.7–10.7)

## 2021-09-02 LAB — HEPATIC FUNCTION PANEL
ALT: 17 U/L (ref 7–35)
AST: 22 (ref 13–35)
Alkaline Phosphatase: 65 (ref 25–125)
Bilirubin, Total: 0.5

## 2021-09-02 LAB — CBC: RBC: 4.28 (ref 3.87–5.11)

## 2021-09-02 LAB — CBC AND DIFFERENTIAL
HCT: 39 (ref 36–46)
Hemoglobin: 12.6 (ref 12.0–16.0)
Neutrophils Absolute: 4.03
Platelets: 267 10*3/uL (ref 150–400)
WBC: 6.6

## 2021-09-02 NOTE — Addendum Note (Signed)
Addended by: Juanetta Beets on: 09/02/2021 03:45 PM   Modules accepted: Orders

## 2021-09-03 ENCOUNTER — Inpatient Hospital Stay: Payer: PPO

## 2021-09-03 VITALS — BP 110/74 | HR 74 | Temp 97.6°F | Resp 18 | Ht 59.0 in | Wt 167.0 lb

## 2021-09-03 DIAGNOSIS — M810 Age-related osteoporosis without current pathological fracture: Secondary | ICD-10-CM

## 2021-09-03 MED ORDER — DENOSUMAB 60 MG/ML ~~LOC~~ SOSY
60.0000 mg | PREFILLED_SYRINGE | Freq: Once | SUBCUTANEOUS | Status: AC
  Administered 2021-09-03: 60 mg via SUBCUTANEOUS
  Filled 2021-09-03: qty 1

## 2021-09-03 NOTE — Patient Instructions (Signed)
Denosumab injection What is this medication? DENOSUMAB (den oh sue mab) slows bone breakdown. Prolia is used to treat osteoporosis in women after menopause and in men, and in people who are taking corticosteroids for 6 months or more. Xgeva is used to treat a high calcium level due to cancer and to prevent bone fractures and other bone problems caused by multiple myeloma or cancer bone metastases. Xgeva is also used to treat giant cell tumor of the bone. This medicine may be used for other purposes; ask your health care provider or pharmacist if you have questions. COMMON BRAND NAME(S): Prolia, XGEVA What should I tell my care team before I take this medication? They need to know if you have any of these conditions: dental disease having surgery or tooth extraction infection kidney disease low levels of calcium or Vitamin D in the blood malnutrition on hemodialysis skin conditions or sensitivity thyroid or parathyroid disease an unusual reaction to denosumab, other medicines, foods, dyes, or preservatives pregnant or trying to get pregnant breast-feeding How should I use this medication? This medicine is for injection under the skin. It is given by a health care professional in a hospital or clinic setting. A special MedGuide will be given to you before each treatment. Be sure to read this information carefully each time. For Prolia, talk to your pediatrician regarding the use of this medicine in children. Special care may be needed. For Xgeva, talk to your pediatrician regarding the use of this medicine in children. While this drug may be prescribed for children as young as 13 years for selected conditions, precautions do apply. Overdosage: If you think you have taken too much of this medicine contact a poison control center or emergency room at once. NOTE: This medicine is only for you. Do not share this medicine with others. What if I miss a dose? It is important not to miss your dose.  Call your doctor or health care professional if you are unable to keep an appointment. What may interact with this medication? Do not take this medicine with any of the following medications: other medicines containing denosumab This medicine may also interact with the following medications: medicines that lower your chance of fighting infection steroid medicines like prednisone or cortisone This list may not describe all possible interactions. Give your health care provider a list of all the medicines, herbs, non-prescription drugs, or dietary supplements you use. Also tell them if you smoke, drink alcohol, or use illegal drugs. Some items may interact with your medicine. What should I watch for while using this medication? Visit your doctor or health care professional for regular checks on your progress. Your doctor or health care professional may order blood tests and other tests to see how you are doing. Call your doctor or health care professional for advice if you get a fever, chills or sore throat, or other symptoms of a cold or flu. Do not treat yourself. This drug may decrease your body's ability to fight infection. Try to avoid being around people who are sick. You should make sure you get enough calcium and vitamin D while you are taking this medicine, unless your doctor tells you not to. Discuss the foods you eat and the vitamins you take with your health care professional. See your dentist regularly. Brush and floss your teeth as directed. Before you have any dental work done, tell your dentist you are receiving this medicine. Do not become pregnant while taking this medicine or for 5 months after   stopping it. Talk with your doctor or health care professional about your birth control options while taking this medicine. Women should inform their doctor if they wish to become pregnant or think they might be pregnant. There is a potential for serious side effects to an unborn child. Talk to  your health care professional or pharmacist for more information. What side effects may I notice from receiving this medication? Side effects that you should report to your doctor or health care professional as soon as possible: allergic reactions like skin rash, itching or hives, swelling of the face, lips, or tongue bone pain breathing problems dizziness jaw pain, especially after dental work redness, blistering, peeling of the skin signs and symptoms of infection like fever or chills; cough; sore throat; pain or trouble passing urine signs of low calcium like fast heartbeat, muscle cramps or muscle pain; pain, tingling, numbness in the hands or feet; seizures unusual bleeding or bruising unusually weak or tired Side effects that usually do not require medical attention (report to your doctor or health care professional if they continue or are bothersome): constipation diarrhea headache joint pain loss of appetite muscle pain runny nose tiredness upset stomach This list may not describe all possible side effects. Call your doctor for medical advice about side effects. You may report side effects to FDA at 1-800-FDA-1088. Where should I keep my medication? This medicine is only given in a clinic, doctor's office, or other health care setting and will not be stored at home. NOTE: This sheet is a summary. It may not cover all possible information. If you have questions about this medicine, talk to your doctor, pharmacist, or health care provider.  2023 Elsevier/Gold Standard (2017-06-24 00:00:00)  

## 2021-09-09 DIAGNOSIS — M2560 Stiffness of unspecified joint, not elsewhere classified: Secondary | ICD-10-CM | POA: Diagnosis not present

## 2021-09-09 DIAGNOSIS — R531 Weakness: Secondary | ICD-10-CM | POA: Diagnosis not present

## 2021-09-09 DIAGNOSIS — M542 Cervicalgia: Secondary | ICD-10-CM | POA: Diagnosis not present

## 2021-09-28 ENCOUNTER — Encounter: Payer: Self-pay | Admitting: Oncology

## 2021-09-28 DIAGNOSIS — M542 Cervicalgia: Secondary | ICD-10-CM | POA: Diagnosis not present

## 2021-09-28 DIAGNOSIS — Z6833 Body mass index (BMI) 33.0-33.9, adult: Secondary | ICD-10-CM | POA: Diagnosis not present

## 2021-09-28 DIAGNOSIS — M5481 Occipital neuralgia: Secondary | ICD-10-CM | POA: Diagnosis not present

## 2021-10-12 ENCOUNTER — Other Ambulatory Visit: Payer: Self-pay | Admitting: Hematology and Oncology

## 2021-10-12 DIAGNOSIS — F32A Depression, unspecified: Secondary | ICD-10-CM

## 2021-10-12 MED ORDER — TRAZODONE HCL 50 MG PO TABS: 50.0000 mg | ORAL_TABLET | Freq: Every day | ORAL | 3 refills | Status: DC

## 2021-10-13 DIAGNOSIS — H2513 Age-related nuclear cataract, bilateral: Secondary | ICD-10-CM | POA: Diagnosis not present

## 2021-10-13 DIAGNOSIS — H25043 Posterior subcapsular polar age-related cataract, bilateral: Secondary | ICD-10-CM | POA: Diagnosis not present

## 2021-10-13 DIAGNOSIS — H2511 Age-related nuclear cataract, right eye: Secondary | ICD-10-CM | POA: Diagnosis not present

## 2021-10-13 DIAGNOSIS — H18413 Arcus senilis, bilateral: Secondary | ICD-10-CM | POA: Diagnosis not present

## 2021-10-13 DIAGNOSIS — H25013 Cortical age-related cataract, bilateral: Secondary | ICD-10-CM | POA: Diagnosis not present

## 2021-10-29 ENCOUNTER — Encounter: Payer: Self-pay | Admitting: Hematology and Oncology

## 2021-11-12 DIAGNOSIS — E785 Hyperlipidemia, unspecified: Secondary | ICD-10-CM | POA: Diagnosis not present

## 2021-11-12 DIAGNOSIS — E1129 Type 2 diabetes mellitus with other diabetic kidney complication: Secondary | ICD-10-CM | POA: Diagnosis not present

## 2021-11-13 DIAGNOSIS — N1831 Chronic kidney disease, stage 3a: Secondary | ICD-10-CM | POA: Diagnosis not present

## 2021-11-13 DIAGNOSIS — K2960 Other gastritis without bleeding: Secondary | ICD-10-CM | POA: Diagnosis not present

## 2021-11-13 DIAGNOSIS — I7143 Infrarenal abdominal aortic aneurysm, without rupture: Secondary | ICD-10-CM | POA: Diagnosis not present

## 2021-11-13 DIAGNOSIS — G43709 Chronic migraine without aura, not intractable, without status migrainosus: Secondary | ICD-10-CM | POA: Diagnosis not present

## 2021-11-13 DIAGNOSIS — E785 Hyperlipidemia, unspecified: Secondary | ICD-10-CM | POA: Diagnosis not present

## 2021-11-13 DIAGNOSIS — K580 Irritable bowel syndrome with diarrhea: Secondary | ICD-10-CM | POA: Diagnosis not present

## 2021-11-13 DIAGNOSIS — C50911 Malignant neoplasm of unspecified site of right female breast: Secondary | ICD-10-CM | POA: Diagnosis not present

## 2021-11-13 DIAGNOSIS — M4722 Other spondylosis with radiculopathy, cervical region: Secondary | ICD-10-CM | POA: Diagnosis not present

## 2021-11-13 DIAGNOSIS — E1129 Type 2 diabetes mellitus with other diabetic kidney complication: Secondary | ICD-10-CM | POA: Diagnosis not present

## 2021-11-13 DIAGNOSIS — R053 Chronic cough: Secondary | ICD-10-CM | POA: Diagnosis not present

## 2021-11-13 DIAGNOSIS — F331 Major depressive disorder, recurrent, moderate: Secondary | ICD-10-CM | POA: Diagnosis not present

## 2021-11-13 DIAGNOSIS — Z0000 Encounter for general adult medical examination without abnormal findings: Secondary | ICD-10-CM | POA: Diagnosis not present

## 2021-11-20 DIAGNOSIS — Z1231 Encounter for screening mammogram for malignant neoplasm of breast: Secondary | ICD-10-CM | POA: Diagnosis not present

## 2021-11-26 ENCOUNTER — Telehealth: Payer: Self-pay

## 2021-11-26 NOTE — Telephone Encounter (Signed)
-----   Message from Derwood Kaplan, MD sent at 11/25/2021  1:49 PM EDT ----- Regarding: call Tell her mammo is clear

## 2021-12-17 DIAGNOSIS — Z6833 Body mass index (BMI) 33.0-33.9, adult: Secondary | ICD-10-CM | POA: Diagnosis not present

## 2021-12-17 DIAGNOSIS — M542 Cervicalgia: Secondary | ICD-10-CM | POA: Diagnosis not present

## 2021-12-18 DIAGNOSIS — M47812 Spondylosis without myelopathy or radiculopathy, cervical region: Secondary | ICD-10-CM | POA: Diagnosis not present

## 2022-01-05 DIAGNOSIS — H6122 Impacted cerumen, left ear: Secondary | ICD-10-CM | POA: Diagnosis not present

## 2022-01-26 DIAGNOSIS — M47812 Spondylosis without myelopathy or radiculopathy, cervical region: Secondary | ICD-10-CM | POA: Diagnosis not present

## 2022-02-10 DIAGNOSIS — H2511 Age-related nuclear cataract, right eye: Secondary | ICD-10-CM | POA: Diagnosis not present

## 2022-02-10 DIAGNOSIS — E119 Type 2 diabetes mellitus without complications: Secondary | ICD-10-CM | POA: Diagnosis not present

## 2022-02-10 DIAGNOSIS — Z7984 Long term (current) use of oral hypoglycemic drugs: Secondary | ICD-10-CM | POA: Diagnosis not present

## 2022-02-10 DIAGNOSIS — H2513 Age-related nuclear cataract, bilateral: Secondary | ICD-10-CM | POA: Diagnosis not present

## 2022-02-10 DIAGNOSIS — H524 Presbyopia: Secondary | ICD-10-CM | POA: Diagnosis not present

## 2022-02-10 DIAGNOSIS — H43392 Other vitreous opacities, left eye: Secondary | ICD-10-CM | POA: Diagnosis not present

## 2022-02-11 DIAGNOSIS — H2512 Age-related nuclear cataract, left eye: Secondary | ICD-10-CM | POA: Diagnosis not present

## 2022-02-24 DIAGNOSIS — H524 Presbyopia: Secondary | ICD-10-CM | POA: Diagnosis not present

## 2022-02-24 DIAGNOSIS — H2513 Age-related nuclear cataract, bilateral: Secondary | ICD-10-CM | POA: Diagnosis not present

## 2022-02-24 DIAGNOSIS — H2511 Age-related nuclear cataract, right eye: Secondary | ICD-10-CM | POA: Diagnosis not present

## 2022-02-24 DIAGNOSIS — H43392 Other vitreous opacities, left eye: Secondary | ICD-10-CM | POA: Diagnosis not present

## 2022-02-24 DIAGNOSIS — Z7984 Long term (current) use of oral hypoglycemic drugs: Secondary | ICD-10-CM | POA: Diagnosis not present

## 2022-02-24 DIAGNOSIS — E119 Type 2 diabetes mellitus without complications: Secondary | ICD-10-CM | POA: Diagnosis not present

## 2022-02-24 DIAGNOSIS — H2512 Age-related nuclear cataract, left eye: Secondary | ICD-10-CM | POA: Diagnosis not present

## 2022-03-05 ENCOUNTER — Other Ambulatory Visit: Payer: PPO

## 2022-03-05 ENCOUNTER — Other Ambulatory Visit: Payer: Self-pay | Admitting: Oncology

## 2022-03-05 ENCOUNTER — Inpatient Hospital Stay: Payer: PPO

## 2022-03-05 ENCOUNTER — Encounter: Payer: Self-pay | Admitting: Oncology

## 2022-03-05 ENCOUNTER — Inpatient Hospital Stay: Payer: PPO | Attending: Oncology | Admitting: Oncology

## 2022-03-05 VITALS — BP 146/86 | HR 78 | Temp 97.8°F | Resp 18 | Ht 59.0 in | Wt 173.1 lb

## 2022-03-05 DIAGNOSIS — C50811 Malignant neoplasm of overlapping sites of right female breast: Secondary | ICD-10-CM | POA: Diagnosis not present

## 2022-03-05 DIAGNOSIS — Z170 Estrogen receptor positive status [ER+]: Secondary | ICD-10-CM

## 2022-03-05 DIAGNOSIS — M810 Age-related osteoporosis without current pathological fracture: Secondary | ICD-10-CM

## 2022-03-05 DIAGNOSIS — C50911 Malignant neoplasm of unspecified site of right female breast: Secondary | ICD-10-CM | POA: Diagnosis not present

## 2022-03-05 LAB — CBC WITH DIFFERENTIAL (CANCER CENTER ONLY)
Abs Immature Granulocytes: 0.01 10*3/uL (ref 0.00–0.07)
Basophils Absolute: 0.1 10*3/uL (ref 0.0–0.1)
Basophils Relative: 1 %
Eosinophils Absolute: 0.2 10*3/uL (ref 0.0–0.5)
Eosinophils Relative: 3 %
HCT: 40.5 % (ref 36.0–46.0)
Hemoglobin: 12.7 g/dL (ref 12.0–15.0)
Immature Granulocytes: 0 %
Lymphocytes Relative: 22 %
Lymphs Abs: 1.3 10*3/uL (ref 0.7–4.0)
MCH: 29.7 pg (ref 26.0–34.0)
MCHC: 31.4 g/dL (ref 30.0–36.0)
MCV: 94.6 fL (ref 80.0–100.0)
Monocytes Absolute: 0.6 10*3/uL (ref 0.1–1.0)
Monocytes Relative: 9 %
Neutro Abs: 3.8 10*3/uL (ref 1.7–7.7)
Neutrophils Relative %: 65 %
Platelet Count: 268 10*3/uL (ref 150–400)
RBC: 4.28 MIL/uL (ref 3.87–5.11)
RDW: 13.6 % (ref 11.5–15.5)
WBC Count: 5.9 10*3/uL (ref 4.0–10.5)
nRBC: 0 % (ref 0.0–0.2)

## 2022-03-05 LAB — CMP (CANCER CENTER ONLY)
ALT: 17 U/L (ref 0–44)
AST: 22 U/L (ref 15–41)
Albumin: 3.9 g/dL (ref 3.5–5.0)
Alkaline Phosphatase: 49 U/L (ref 38–126)
Anion gap: 10 (ref 5–15)
BUN: 16 mg/dL (ref 8–23)
CO2: 23 mmol/L (ref 22–32)
Calcium: 9.1 mg/dL (ref 8.9–10.3)
Chloride: 104 mmol/L (ref 98–111)
Creatinine: 0.92 mg/dL (ref 0.44–1.00)
GFR, Estimated: >60 mL/min (ref >60)
Glucose, Bld: 184 mg/dL — ABNORMAL HIGH (ref 70–99)
Potassium: 4.2 mmol/L (ref 3.5–5.1)
Sodium: 137 mmol/L (ref 135–145)
Total Bilirubin: 0.5 mg/dL (ref 0.3–1.2)
Total Protein: 6.7 g/dL (ref 6.5–8.1)

## 2022-03-05 NOTE — Progress Notes (Signed)
[facility]  [address] [place],  [place]  [address] [phone]  Clinic Day: 03/05/22   Referring physician: Emmaline Kluver, *   CHIEF COMPLAINT:  CC: A 77 year old woman with stage IIA (T2 N69mc M0) hormone positive breast cancer and osteoporosis  Current Treatment:  Anastrazole and Prolia   HISTORY OF PRESENT ILLNESS:  Sheena CREANis a 77y.o. female with a history of stage IIA (T2 N124m M0) hormone positive breast cancer diagnosed in July 2019.  Her history dates back to May 2019 when a breast cancer was discovered on a screening mammogram, which revealed a possible mass in the right breast.  Diagnostic right mammogram revealed a mass in the superior central right breast, which measure 1.3 cm on ultrasound.  There was also a second similar mass measuring 1.1 cm, as well as borderline cortical thickening of a single right axilla.  Biopsies of both lesions were done.  The first revealed a grade 1, invasive lobular carcinoma along with lobular carcinoma in situ and the other biopsy site revealed a 5 mm focus of proliferative fibrocystic changes.  Estrogen and progesterone receptors were highly positive and HER 2 negative.  Ki 67 was 1%.  MRI  breast confirmed multifocal disease with adjacent enhancing masses in the upper inner quadrant of the right breast.  No other malignancy was found in either breast and no pathologic lymphadenopathy was seen.  She was treated with right breast lumpectomy in August 2019.  Pathology revealed a 4.5 cm, grade 2, invasive lobular carcinoma with lobular carcinoma in situ.  Two lymph nodes had micrometastases and 2 lymph nodes were negative for metastasis.  Margins were clear.  No lymphovascular or perineural invasion was seen.  EndoPredict testing revealed a high risk recurrence score.  Due to her personal and family history of malignancy, she underwent testing for hereditary cancer syndromes with the [employer] myRisk  hereditary cancer panel test.  This did not reveal any clinically significant mutation or variant of uncertain significance.  She was recommended for adjuvant chemotherapy with docetaxel/cyclophosphamide due to her high risk EndoPredict score.  She had difficulty tolerating treatment due to severe bone pain and headaches.  She then developed a subsegmental right lower lobe pulmonary embolism in October 2019.  She was initially treated with enoxaparin 1 milligram/kilogram twice daily for 1 month, then 1.5 milligrams/kilogram daily.  She was switched to rivaroxaban in December.  She completed 4 cycles of docetaxel/cyclophosphamide in December 2019. Due to toxicities, the chemotherapy doses were decreased by 20% with her 2nd cycle.  She required IV fluid support and pain medication throughout chemotherapy.   Bone density scan in February 2020 revealed osteoporosis, with a T-score of -2.2 of the spine for osteopenia and -2.6 for the right femur.  We really do not have a comparison study since 2012 and she had osteoporosis at that time, so this not a new problem, but has gone untreated.  She did not tolerate alendronate previously.  She is on calcium with vitamin-D 1500 mg daily.  She was started on anastrozole 1 mg daily at the end of January 2020.  Since completion of chemotherapy, the patient has complained of cough.  We recommended treatment of the osteoporosis with Prolia and she started that in May 2020 and had an increase in bone pain.  She continued to complain of bone pain in August.  She was seen in November 2020 prior to 2nd dose of Prolia, at which time she  reported back pain.  MRI through her orthopedic office apparently revealed degenerative disc changes.  She also reported chronic nonproductive cough.  We recommended she continue pantoprazole 40 mg daily, as we felt acid reflux could be contributing to her cough.Annual bilateral mammogram in August of 2022 was clear.  She had COVID-19 in November 2022.   A bone density scan in June 2022 still shows osteoporosis of the femur with a T score stable at -2.6.  The spine shows improvement from a T score of -2.2 down to -1.4.  INTERVAL HISTORY:  Sheena Joyce is here for 4 month evaluation for stage IIA (T2 N78mc M0) hormone positive breast cancer. She continues her anastrazole since January of 2020 without significant difficulty. She was widowed last year. She states that she is feeling well and will be receiving a hearing aid in the upcoming future. She does have chronic head and neck pain occasionally with nausea and surgery is planned. She has had bilateral cataract surgery. She informed me that her hair is thinning and coming out a lot. I recommended that she continue her hormone blocker at least for 5 years, but most likely we will continue longer since she is a high risk for recurrence, unless she has significant toxicities. She will be due for a repeat bone density scan. She will be due forProlia next week. She denies signs of infection such as sore throat, sinus drainage, cough, or urinary symptoms.  She denies fevers or recurrent chills. She denies pain. She denies nausea, vomiting, chest pain, dyspnea or cough. Her weight has been stable.   REVIEW OF SYSTEMS:  Review of Systems  Constitutional: Negative.  Negative for appetite change, chills, diaphoresis, fatigue, fever and unexpected weight change.  HENT:   Negative for hearing loss, lump/mass, mouth sores, nosebleeds, sore throat, tinnitus, trouble swallowing and voice change.        Alopecia  Eyes: Negative.  Negative for eye problems and icterus.  Respiratory: Negative.  Negative for chest tightness, cough, hemoptysis, shortness of breath and wheezing.   Cardiovascular: Negative.  Negative for chest pain, leg swelling and palpitations.  Gastrointestinal: Negative.  Negative for abdominal distention, abdominal pain, blood in stool, constipation, diarrhea, nausea, rectal pain and vomiting.  Endocrine:  Negative.  Negative for hot flashes.  Genitourinary: Negative.  Negative for bladder incontinence, difficulty urinating, dyspareunia, dysuria, frequency, hematuria, menstrual problem, nocturia, pelvic pain, vaginal bleeding and vaginal discharge.   Musculoskeletal: Negative.  Negative for arthralgias, back pain, flank pain, gait problem, myalgias, neck pain and neck stiffness.  Skin: Negative.  Negative for itching, rash and wound.  Neurological: Negative.  Negative for dizziness, extremity weakness, gait problem, headaches, light-headedness, numbness, seizures and speech difficulty.  Hematological: Negative.  Negative for adenopathy. Does not bruise/bleed easily.  Psychiatric/Behavioral: Negative.  Negative for confusion, decreased concentration, depression, sleep disturbance and suicidal ideas. The patient is not nervous/anxious.   All other systems reviewed and are negative.    VITALS:  Blood pressure (!) 146/86, pulse 78, temperature 97.8 F (36.6 C), temperature source Oral, resp. rate 18, height '4\' 11"'$  (1.499 m), weight 173 lb 1.6 oz (78.5 kg), SpO2 96 %.  Wt Readings from Last 3 Encounters:  03/08/22 173 lb (78.5 kg)  03/05/22 173 lb 1.6 oz (78.5 kg)  09/03/21 167 lb (75.8 kg)    Body mass index is 34.96 kg/m.  Performance status (ECOG): 1 - Symptomatic but completely ambulatory  PHYSICAL EXAM:  Physical Exam Vitals and nursing note reviewed.  Constitutional:  General: She is not in acute distress.    Appearance: Normal appearance. She is normal weight. She is not ill-appearing, toxic-appearing or diaphoretic.  HENT:     Head: Normocephalic and atraumatic.     Right Ear: Tympanic membrane, ear canal and external ear normal. There is no impacted cerumen.     Left Ear: Tympanic membrane, ear canal and external ear normal. There is no impacted cerumen.     Nose: Nose normal. No congestion or rhinorrhea.     Mouth/Throat:     Mouth: Mucous membranes are moist.     Pharynx:  Oropharynx is clear. No oropharyngeal exudate or posterior oropharyngeal erythema.  Eyes:     General: No scleral icterus.       Right eye: No discharge.        Left eye: No discharge.     Extraocular Movements: Extraocular movements intact.     Conjunctiva/sclera: Conjunctivae normal.     Pupils: Pupils are equal, round, and reactive to light.  Neck:     Vascular: No carotid bruit.  Cardiovascular:     Rate and Rhythm: Normal rate and regular rhythm.     Pulses: Normal pulses.     Heart sounds: Normal heart sounds. No murmur heard.    No friction rub. No gallop.  Pulmonary:     Effort: Pulmonary effort is normal. No respiratory distress.     Breath sounds: Normal breath sounds. No stridor. No wheezing, rhonchi or rales.  Chest:     Chest wall: No mass, lacerations, deformity, swelling, tenderness, crepitus or edema. There is no dullness to percussion.  Breasts:    Breasts are symmetrical.     Right: Normal. No swelling, bleeding, inverted nipple, mass, nipple discharge, skin change or tenderness.     Left: Normal. No swelling, bleeding, inverted nipple, mass, nipple discharge, skin change or tenderness.     Comments: Well healed scar in the upper outer quadrant of right breast. Bilateral axilla are tender but no discreet adenopathy.  Abdominal:     General: Abdomen is flat. Bowel sounds are normal. There is no distension.     Palpations: Abdomen is soft. There is no mass.     Tenderness: There is no abdominal tenderness. There is no right CVA tenderness, left CVA tenderness, guarding or rebound.     Hernia: No hernia is present.  Musculoskeletal:        General: No swelling, tenderness, deformity or signs of injury. Normal range of motion.     Cervical back: Normal range of motion and neck supple. No rigidity or tenderness.     Right lower leg: No edema.     Left lower leg: No edema.  Lymphadenopathy:     Cervical: No cervical adenopathy.     Upper Body:     Right upper body:  No supraclavicular, axillary or pectoral adenopathy.     Left upper body: No supraclavicular, axillary or pectoral adenopathy.  Skin:    General: Skin is warm and dry.     Capillary Refill: Capillary refill takes less than 2 seconds.     Coloration: Skin is not jaundiced or pale.     Findings: No bruising, erythema, lesion or rash.  Neurological:     General: No focal deficit present.     Mental Status: She is alert and oriented to person, place, and time. Mental status is at baseline.     Cranial Nerves: No cranial nerve deficit.     Sensory: No  sensory deficit.     Motor: No weakness.     Coordination: Coordination normal.     Gait: Gait normal.     Deep Tendon Reflexes: Reflexes normal.  Psychiatric:        Mood and Affect: Mood normal.        Behavior: Behavior normal.        Thought Content: Thought content normal.        Judgment: Judgment normal.     LABS:      Latest Ref Rng & Units 03/05/2022   10:44 AM 09/02/2021   12:00 AM 03/04/2021   12:00 AM  CBC  WBC 4.0 - 10.5 K/uL 5.9  6.6     7.6      Hemoglobin 12.0 - 15.0 g/dL 12.7  12.6     12.9      Hematocrit 36.0 - 46.0 % 40.5  39     40      Platelets 150 - 400 K/uL 268  267     282         This result is from an external source.      Latest Ref Rng & Units 03/05/2022   10:44 AM 09/02/2021   12:00 AM 03/04/2021   12:00 AM  CMP  Glucose 70 - 99 mg/dL 184     BUN 8 - 23 mg/dL '16  18     12      '$ Creatinine 0.44 - 1.00 mg/dL 0.92  0.8     1.0      Sodium 135 - 145 mmol/L 137  136     138      Potassium 3.5 - 5.1 mmol/L 4.2  4.2     4.1      Chloride 98 - 111 mmol/L 104  104     104      CO2 22 - 32 mmol/L '23  24     25      '$ Calcium 8.9 - 10.3 mg/dL 9.1  9.5     9.4      Total Protein 6.5 - 8.1 g/dL 6.7     Total Bilirubin 0.3 - 1.2 mg/dL 0.5     Alkaline Phos 38 - 126 U/L 49  65     67      AST 15 - 41 U/L '22  22     29      '$ ALT 0 - 44 U/L '17  17     20         '$ This result is from an external source.   No results  found for: "CEA1", "CEA" / No results found for: "CEA1", "CEA" No results found for: "PSA1" No results found for: "DHR416" No results found for: "CAN125"  No results found for: "TOTALPROTELP", "ALBUMINELP", "A1GS", "A2GS", "BETS", "BETA2SER", "GAMS", "MSPIKE", "SPEI" No results found for: "TIBC", "FERRITIN", "IRONPCTSAT" No results found for: "LDH"  STUDIES:      HISTORY:   Past Medical History:  Diagnosis Date   Anxiety    Arthritis    Cancer of overlapping sites of right female breast ([place]) 3/84/5364   Complication of anesthesia    "became unresposive after surgery"- 2017 , aspirated after surgery and had pneumonia- 2017    Depression    Diabetes mellitus without complication ([place])    "on the borderline"   Diverticulitis    Family history of adverse reaction to anesthesia    mother slow to wake up afgter 5 days,  sister had port placed and had issues - went into coma but was very sick    GERD (gastroesophageal reflux disease)    Headache    Pneumonia    PONV (postoperative nausea and vomiting)    Prolapsed uterus    and bladder    Past Surgical History:  Procedure Laterality Date   BREAST LUMPECTOMY WITH RADIOACTIVE SEED AND SENTINEL LYMPH NODE BIOPSY Right 10/19/2017   Procedure: INJECT BLUE DYE RIGHT BREAST AND RIGHT BREAST LUMPECTOMY WITH TWO RADIOACTIVE SEEDS AND RIGHT AXILLARY SENTINEL LYMPH NODE BIOPSY;  Surgeon: [name], MD;  Location: [facility];  Service: General;  Laterality: Right;   COLON SURGERY     HERNIA REPAIR     PORTACATH PLACEMENT Left 12/07/2017   Procedure: INSERTION PORT-A-CATH WITH ULTRA SOUND;  Surgeon: [name], MD;  Location: [facility];  Service: General;  Laterality: Left;    Family History  Problem Relation Age of Onset   Osteoporosis Mother    Heart disease Father     Social History:  reports that she has never smoked. She has never used smokeless tobacco. She reports that she does not drink alcohol and does not use drugs.The  patient is alone  today.  Allergies:  Allergies  Allergen Reactions   Other     Other reaction(s): Other (See Comments) Pt states medication after sx made her "unresponsive", "sounds like oxycodone but can't remember the name"   Codeine Rash   Hydrocodone Rash    Current Medications: Current Outpatient Medications  Medication Sig Dispense Refill   anastrozole (ARIMIDEX) 1 MG tablet Take 1 tablet (1 mg total) by mouth daily. 90 tablet 3   gatifloxacin (ZYMAXID) 0.5 % SOLN Place 1 drop into the left eye 4 (four) times daily.     ketorolac (ACULAR) 0.5 % ophthalmic solution Place 1 drop into the left eye 4 (four) times daily.     moxifloxacin (VIGAMOX) 0.5 % ophthalmic solution Place 1 drop into the right eye 4 (four) times daily.     pantoprazole (PROTONIX) 40 MG tablet Take 1 tablet by mouth daily.     prednisoLONE acetate (PRED FORTE) 1 % ophthalmic suspension Place 1 drop into the left eye 4 (four) times daily.     rizatriptan (MAXALT) 10 MG tablet Take 5-10 mg by mouth 2 (two) times daily as needed for migraine. May repeat in 2 hours if needed     simvastatin (ZOCOR) 20 MG tablet Take 20 mg by mouth at bedtime.     traZODone (DESYREL) 50 MG tablet Take 1 tablet (50 mg total) by mouth at bedtime. 90 tablet 3   venlafaxine XR (EFFEXOR-XR) 75 MG 24 hr capsule Take 75 mg by mouth daily with breakfast.     Calcium-Vitamin D-Vitamin K 650-12.5-40 MG-MCG-MCG CHEW Chew 1,300 mg by mouth daily.     Multiple Vitamin (MULTI-VITAMIN) tablet Take 2 tablets by mouth daily.     No current facility-administered medications for this visit.     ASSESSMENT & PLAN:   Assessment:  Sheena Joyce is a 77 y.o. female with:  1. Stage IIA breast cancer.  She remains without evidence of recurrence, now 4 years out.  She will continue anastrozole for least a total of 5 years, as long as we can treat her osteoporosis. She has already completed 3 and a half years.  However I pulled out her original Endopredict  report from September of 2019.  This was high risk with an EpClin score of 3.8  for a 15% risk of distant recurrence in the next 10 years.  Her absolute chemotherapy benefit was 6%.  Her likelihood of a late distant recurrence in years 5-15 was 12%, which I consider fairly high.  We will therefore continue consider treating her with extended adjuvant hormonal therapy for 7 to 10 years total if we can control her osteoporosis and she has no other toxicities.  2. History of subsegmental pulmonary embolism in October 2019.  She treated with enoxaparin, then rivaroxaban.  Anticoagulation was discontinued in February 2020. She does not have symptoms suggestive of recurrence.  3. Osteoporosis, for which she is receiving Prolia injections every 6 months as of May 2020.  She had significant bone pain associated with this, but started Claritin and did not have as much bone pain with her next injection.    4. History of COVID 19 in November of 2022.    Plan: She will be due back in 6  months with CBC, CMP, and bone density scan, and will be due for her next Prolia. She will continue her anastrozole daily. She will get her Prolia injection next Monday 03/08/22. She verbalizes understanding of and agreement to the plans discussed today. She knows to call the office should any new questions or concerns arise.     I provided 25 minutes of face-to-face time during this this encounter and > 50% was spent counseling as documented under my assessment and plan.     I,Jasmine M Lassiter,acting as a scribe for Derwood Kaplan, MD.,have documented all relevant documentation on the behalf of Derwood Kaplan, MD,as directed by  Derwood Kaplan, MD while in the presence of Derwood Kaplan, MD.   Derwood Kaplan, MD

## 2022-03-08 ENCOUNTER — Inpatient Hospital Stay: Payer: PPO

## 2022-03-08 VITALS — BP 100/87 | HR 89 | Temp 98.0°F | Resp 18 | Ht 59.0 in | Wt 173.0 lb

## 2022-03-08 DIAGNOSIS — M810 Age-related osteoporosis without current pathological fracture: Secondary | ICD-10-CM

## 2022-03-08 MED ORDER — DENOSUMAB 60 MG/ML ~~LOC~~ SOSY
60.0000 mg | PREFILLED_SYRINGE | Freq: Once | SUBCUTANEOUS | Status: AC
  Administered 2022-03-08: 60 mg via SUBCUTANEOUS
  Filled 2022-03-08: qty 1

## 2022-03-08 MED ORDER — SODIUM CHLORIDE 0.9 % IV SOLN: Freq: Once | INTRAVENOUS | Status: DC

## 2022-03-08 NOTE — Patient Instructions (Signed)
Denosumab Injection (Osteoporosis) What is this medication? DENOSUMAB (den oh SUE mab) prevents and treats osteoporosis. It works by making your bones stronger and less likely to break (fracture). It is a monoclonal antibody. This medicine may be used for other purposes; ask your health care provider or pharmacist if you have questions. COMMON BRAND NAME(S): Prolia What should I tell my care team before I take this medication? They need to know if you have any of these conditions: Dental or gum disease, or plan to have dental surgery or a tooth pulled Infection Kidney disease Low levels of calcium or vitamin D in your blood On dialysis Poor nutrition Skin conditions Thyroid disease, or have had thyroid or parathyroid surgery Trouble absorbing minerals in your stomach or intestine An unusual or allergic reaction to denosumab, other medications, foods, dyes, or preservatives Pregnant or trying to get pregnant Breastfeeding How should I use this medication? This medication is injected under the skin. It is given by your care team in a hospital or clinic setting. A special MedGuide will be given to you before each treatment. Be sure to read this information carefully each time. Talk to your care team about the use of this medication in children. Special care may be needed. Overdosage: If you think you have taken too much of this medicine contact a poison control center or emergency room at once. NOTE: This medicine is only for you. Do not share this medicine with others. What if I miss a dose? Keep appointments for follow-up doses. It is important not to miss your dose. Call your care team if you are unable to keep an appointment. What may interact with this medication? Do not take this medication with any of the following: Other medications that contain denosumab This medication may also interact with the following: Medications that lower your chance of fighting infection Steroid  medications, such as prednisone or cortisone This list may not describe all possible interactions. Give your health care provider a list of all the medicines, herbs, non-prescription drugs, or dietary supplements you use. Also tell them if you smoke, drink alcohol, or use illegal drugs. Some items may interact with your medicine. What should I watch for while using this medication? Your condition will be monitored carefully while you are receiving this medication. You may need blood work while taking this medication. This medication may increase your risk of getting an infection. Call your care team for advice if you get a fever, chills, sore throat, or other symptoms of a cold or flu. Do not treat yourself. Try to avoid being around people who are sick. Tell your dentist and dental surgeon that you are taking this medication. You should not have major dental surgery while on this medication. See your dentist to have a dental exam and fix any dental problems before starting this medication. Take good care of your teeth while on this medication. Make sure you see your dentist for regular follow-up appointments. You should make sure you get enough calcium and vitamin D while you are taking this medication. Discuss the foods you eat and the vitamins you take with your care team. Talk to your care team if you are pregnant or think you might be pregnant. This medication can cause serious birth defects if taken during pregnancy and for 5 months after the last dose. You will need a negative pregnancy test before starting this medication. Contraception is recommended while taking this medication and for 5 months after the last dose. Your care   team can help you find the option that works for you. Talk to your care team before breastfeeding. Changes to your treatment plan may be needed. What side effects may I notice from receiving this medication? Side effects that you should report to your care team as soon as  possible: Allergic reactions--skin rash, itching, hives, swelling of the face, lips, tongue, or throat Infection--fever, chills, cough, sore throat, wounds that don't heal, pain or trouble when passing urine, general feeling of discomfort or being unwell Low calcium level--muscle pain or cramps, confusion, tingling, or numbness in the hands or feet Osteonecrosis of the jaw--pain, swelling, or redness in the mouth, numbness of the jaw, poor healing after dental work, unusual discharge from the mouth, visible bones in the mouth Severe bone, joint, or muscle pain Skin infection--skin redness, swelling, warmth, or pain Side effects that usually do not require medical attention (report these to your care team if they continue or are bothersome): Back pain Headache Joint pain Muscle pain Pain in the hands, arms, legs, or feet Runny or stuffy nose Sore throat This list may not describe all possible side effects. Call your doctor for medical advice about side effects. You may report side effects to FDA at 1-800-FDA-1088. Where should I keep my medication? This medication is given in a hospital or clinic. It will not be stored at home. NOTE: This sheet is a summary. It may not cover all possible information. If you have questions about this medicine, talk to your doctor, pharmacist, or health care provider.  2023 [employer] (2021-06-29 00:00:00)  

## 2022-03-11 DIAGNOSIS — M8589 Other specified disorders of bone density and structure, multiple sites: Secondary | ICD-10-CM | POA: Diagnosis not present

## 2022-03-11 DIAGNOSIS — M810 Age-related osteoporosis without current pathological fracture: Secondary | ICD-10-CM | POA: Diagnosis not present

## 2022-03-15 ENCOUNTER — Encounter: Payer: Self-pay | Admitting: Oncology

## 2022-03-22 ENCOUNTER — Telehealth: Payer: Self-pay

## 2022-03-22 NOTE — Telephone Encounter (Signed)
-----  Message from Derwood Kaplan, MD sent at 03/21/2022  8:20 PM EST ----- Regarding: call Tell her labs look good.  The bone density shows some improvement but still osteoporosis.  I rec. She continue the Prolia.

## 2022-03-23 ENCOUNTER — Telehealth: Payer: Self-pay

## 2022-03-23 NOTE — Telephone Encounter (Signed)
Attempted to contact patient. No answer and no VM.  

## 2022-03-23 NOTE — Telephone Encounter (Signed)
-----  Message from Derwood Kaplan, MD sent at 03/21/2022  8:20 PM EST ----- Regarding: call Tell her labs look good.  The bone density shows some improvement but still osteoporosis.  I rec. She continue the Prolia.

## 2022-03-24 DIAGNOSIS — M47812 Spondylosis without myelopathy or radiculopathy, cervical region: Secondary | ICD-10-CM | POA: Diagnosis not present

## 2022-03-28 ENCOUNTER — Encounter: Payer: Self-pay | Admitting: Oncology

## 2022-03-30 ENCOUNTER — Telehealth: Payer: Self-pay

## 2022-03-30 NOTE — Telephone Encounter (Signed)
-----  Message from Derwood Kaplan, MD sent at 03/28/2022  7:51 PM EST ----- Regarding: DEXA She is on Prolia and last DEXA was June 2022 so I put in order for next one in June of this year and instead it was scheduled this month, 03/11/22.  I hope the insurance will still pay for it since not 2 years - they should, since she has osteoporosis and is on hormonal therapy, but I meant to wait  Sheena Joyce, tell her she still has osteoporosis but one of the readings has mildly improved, I rec she stay on the Prolia.

## 2022-03-30 NOTE — Telephone Encounter (Signed)
Attempted to contact patient. No answer and no VM.  

## 2022-05-05 DIAGNOSIS — E119 Type 2 diabetes mellitus without complications: Secondary | ICD-10-CM | POA: Diagnosis not present

## 2022-05-05 DIAGNOSIS — H43392 Other vitreous opacities, left eye: Secondary | ICD-10-CM | POA: Diagnosis not present

## 2022-05-05 DIAGNOSIS — Z7984 Long term (current) use of oral hypoglycemic drugs: Secondary | ICD-10-CM | POA: Diagnosis not present

## 2022-05-05 DIAGNOSIS — H1033 Unspecified acute conjunctivitis, bilateral: Secondary | ICD-10-CM | POA: Diagnosis not present

## 2022-05-25 DIAGNOSIS — Z6832 Body mass index (BMI) 32.0-32.9, adult: Secondary | ICD-10-CM | POA: Diagnosis not present

## 2022-05-25 DIAGNOSIS — M47812 Spondylosis without myelopathy or radiculopathy, cervical region: Secondary | ICD-10-CM | POA: Diagnosis not present

## 2022-07-06 ENCOUNTER — Other Ambulatory Visit: Payer: Self-pay | Admitting: Hematology and Oncology

## 2022-07-06 MED ORDER — ANASTROZOLE 1 MG PO TABS: 1.0000 mg | ORAL_TABLET | Freq: Every day | ORAL | 3 refills | Status: DC

## 2022-07-12 DIAGNOSIS — I739 Peripheral vascular disease, unspecified: Secondary | ICD-10-CM | POA: Diagnosis not present

## 2022-07-12 DIAGNOSIS — F4321 Adjustment disorder with depressed mood: Secondary | ICD-10-CM | POA: Diagnosis not present

## 2022-07-12 DIAGNOSIS — Z634 Disappearance and death of family member: Secondary | ICD-10-CM | POA: Diagnosis not present

## 2022-07-12 DIAGNOSIS — E119 Type 2 diabetes mellitus without complications: Secondary | ICD-10-CM | POA: Diagnosis not present

## 2022-07-12 DIAGNOSIS — M810 Age-related osteoporosis without current pathological fracture: Secondary | ICD-10-CM | POA: Diagnosis not present

## 2022-07-12 DIAGNOSIS — F330 Major depressive disorder, recurrent, mild: Secondary | ICD-10-CM | POA: Diagnosis not present

## 2022-07-12 DIAGNOSIS — E785 Hyperlipidemia, unspecified: Secondary | ICD-10-CM | POA: Diagnosis not present

## 2022-07-12 DIAGNOSIS — I8393 Asymptomatic varicose veins of bilateral lower extremities: Secondary | ICD-10-CM | POA: Diagnosis not present

## 2022-07-12 DIAGNOSIS — M1990 Unspecified osteoarthritis, unspecified site: Secondary | ICD-10-CM | POA: Diagnosis not present

## 2022-07-12 DIAGNOSIS — Z6833 Body mass index (BMI) 33.0-33.9, adult: Secondary | ICD-10-CM | POA: Diagnosis not present

## 2022-09-01 ENCOUNTER — Encounter: Payer: Self-pay | Admitting: Oncology

## 2022-09-07 NOTE — Progress Notes (Signed)
[facility]  [address] [place],  [place]  [address] [phone]  Clinic Day:09/08/22   Referring physician: Street, Stephanie Coup, *   CHIEF COMPLAINT:  CC: A 77 year old woman with stage IIA (T2 N69mic M0) hormone positive breast cancer and osteoporosis  Current Treatment:  Anastrazole and Prolia   HISTORY OF PRESENT ILLNESS:  Sheena Joyce is a 77 y.o. female with a history of stage IIA (T2 N39mic M0) hormone positive breast cancer diagnosed in July 2019.  Her history dates back to May 2019 when a breast cancer was discovered on a screening mammogram, which revealed a possible mass in the right breast.  Diagnostic right mammogram revealed a mass in the superior central right breast, which measure 1.3 cm on ultrasound.  There was also a second similar mass measuring 1.1 cm, as well as borderline cortical thickening of a single right axilla.  Biopsies of both lesions were done.  The first revealed a grade 1, invasive lobular carcinoma along with lobular carcinoma in situ and the other biopsy site revealed a 5 mm focus of proliferative fibrocystic changes.  Estrogen and progesterone receptors were highly positive and HER 2 negative.  Ki 67 was 1%.  MRI  breast confirmed multifocal disease with adjacent enhancing masses in the upper inner quadrant of the right breast.  No other malignancy was found in either breast and no pathologic lymphadenopathy was seen.  She was treated with right breast lumpectomy in August 2019.  Pathology revealed a 4.5 cm, grade 2, invasive lobular carcinoma with lobular carcinoma in situ.  Two lymph nodes had micrometastases and 2 lymph nodes were negative for metastasis.  Margins were clear.  No lymphovascular or perineural invasion was seen.  EndoPredict testing revealed a high risk recurrence score.  Due to her personal and family history of malignancy, she underwent testing for hereditary cancer syndromes with the [employer] myRisk  hereditary cancer panel test.  This did not reveal any clinically significant mutation or variant of uncertain significance.  She was recommended for adjuvant chemotherapy with docetaxel/cyclophosphamide due to her high risk EndoPredict score.  She had difficulty tolerating treatment due to severe bone pain and headaches.  She then developed a subsegmental right lower lobe pulmonary embolism in October 2019.  She was initially treated with enoxaparin 1 milligram/kilogram twice daily for 1 month, then 1.5 milligrams/kilogram daily.  She was switched to rivaroxaban in December.  She completed 4 cycles of docetaxel/cyclophosphamide in December 2019. Due to toxicities, the chemotherapy doses were decreased by 20% with her 2nd cycle.  She required IV fluid support and pain medication throughout chemotherapy.   Bone density scan in February 2020 revealed osteoporosis, with a T-score of -2.2 of the spine for osteopenia and -2.6 for the right femur.  We really do not have a comparison study since 2012 and she had osteoporosis at that time, so this not a new problem, but has gone untreated.  She did not tolerate alendronate previously.  She is on calcium with vitamin-D 1500 mg daily.  She was started on anastrozole 1 mg daily at the end of January 2020.  Since completion of chemotherapy, the patient has complained of cough.  We recommended treatment of the osteoporosis with Prolia and she started that in May 2020 and had an increase in bone pain.  She continued to complain of bone pain in August.  She was seen in November 2020 prior to 2nd dose of Prolia, at which time she reported  back pain.  MRI through her orthopedic office apparently revealed degenerative disc changes.  She also reported chronic nonproductive cough.  We recommended she continue pantoprazole 40 mg daily, as we felt acid reflux could be contributing to her cough.Annual bilateral mammogram in August of 2022 was clear.  She had COVID-19 in November 2022.   A bone density scan in June 2022 still shows osteoporosis of the femur with a T score stable at -2.6.  The spine shows improvement from a T score of -2.2 down to -1.4.  INTERVAL HISTORY:  Sheena Joyce is here for 4 month evaluation for stage IIA (T2 N57mic M0) hormone positive breast cancer. She continues her anastrazole since January of 2020 without significant difficulty. She is widowed last year after 57 years of marriage, and he had dementia at the end. She is still grieving and does complain of depression which has been chronic. Patient states that She doesn't feel too good and complains of nausea, weakness and constant dizziness. She gets the nausea when she takes her medications and I advised her to take the medication with food. She has a loss of appetite, and has lost weight. She also complains of bloating and abdominal swelling, she has no change in bowel movements. I ordered an abdominal and pelvic CT scan today. There is no family history of ovarian cancer but I advised her to see the gynecologist to check her ovaries and uterus since it has been at least 3-4 years. She is due for her Prolia injection this week. Her last DEXA was in January 2024 and it revealed stable osteoporosis and osteopenia in her arms. I will schedule her next mammogram in October, 2024.  I will see her  back in 6 months with CBC and CMP. She  denies signs of infections such as sore throat, sinus drainage, cough or urinary symptoms. She  denies fever or recurrent chills. She  also deny vomiting, chest pain dyspnea or cough. Her  appetite is not good and Her  weight has decreased 7 pounds over last 6 months     REVIEW OF SYSTEMS:  Review of Systems  Constitutional:  Positive for fatigue and unexpected weight change. Negative for appetite change, chills, diaphoresis and fever.  HENT:   Negative for hearing loss, lump/mass, mouth sores, nosebleeds, sore throat, tinnitus, trouble swallowing and voice change.        Alopecia  Eyes:  Negative.  Negative for eye problems and icterus.  Respiratory: Negative.  Negative for chest tightness, cough, hemoptysis, shortness of breath and wheezing.   Cardiovascular: Negative.  Negative for chest pain, leg swelling and palpitations.  Gastrointestinal:  Positive for nausea. Negative for abdominal distention, abdominal pain, blood in stool, constipation, diarrhea, rectal pain and vomiting.       Abdominal bloating  Endocrine: Positive for hot flashes.  Genitourinary: Negative.  Negative for bladder incontinence, difficulty urinating, dyspareunia, dysuria, frequency, hematuria, menstrual problem, nocturia, pelvic pain, vaginal bleeding and vaginal discharge.   Musculoskeletal: Negative.  Negative for arthralgias, back pain, flank pain, gait problem, myalgias, neck pain and neck stiffness.  Skin: Negative.  Negative for itching, rash and wound.  Neurological: Negative.  Negative for dizziness, extremity weakness, gait problem, headaches, light-headedness, numbness, seizures and speech difficulty.  Hematological: Negative.  Negative for adenopathy. Does not bruise/bleed easily.  Psychiatric/Behavioral:  Positive for depression. Negative for confusion, decreased concentration, sleep disturbance and suicidal ideas. The patient is not nervous/anxious.   All other systems reviewed and are negative.    VITALS:  Blood pressure (!) 114/96, pulse 85, temperature 97.8 F (36.6 C), temperature source Oral, resp. rate 18, height 4\' 11"  (1.499 m), weight 166 lb 4.8 oz (75.4 kg), SpO2 97%.  Wt Readings from Last 3 Encounters:  09/08/22 166 lb 4.8 oz (75.4 kg)  03/08/22 173 lb (78.5 kg)  03/05/22 173 lb 1.6 oz (78.5 kg)    Body mass index is 33.59 kg/m.  Performance status (ECOG): 1 - Symptomatic but completely ambulatory  PHYSICAL EXAM:  Physical Exam Vitals and nursing note reviewed.  Constitutional:      General: She is not in acute distress.    Appearance: Normal appearance. She is normal  weight. She is not ill-appearing, toxic-appearing or diaphoretic.  HENT:     Head: Normocephalic and atraumatic.     Right Ear: Tympanic membrane, ear canal and external ear normal. There is no impacted cerumen.     Left Ear: Tympanic membrane, ear canal and external ear normal. There is no impacted cerumen.     Nose: Nose normal. No congestion or rhinorrhea.     Mouth/Throat:     Mouth: Mucous membranes are moist.     Pharynx: Oropharynx is clear. No oropharyngeal exudate or posterior oropharyngeal erythema.  Eyes:     General: No scleral icterus.       Right eye: No discharge.        Left eye: No discharge.     Extraocular Movements: Extraocular movements intact.     Conjunctiva/sclera: Conjunctivae normal.     Pupils: Pupils are equal, round, and reactive to light.  Neck:     Vascular: No carotid bruit.  Cardiovascular:     Rate and Rhythm: Normal rate and regular rhythm.     Pulses: Normal pulses.     Heart sounds: Normal heart sounds. No murmur heard.    No friction rub. No gallop.  Pulmonary:     Effort: Pulmonary effort is normal. No respiratory distress.     Breath sounds: Normal breath sounds. No stridor. No wheezing, rhonchi or rales.  Chest:     Chest wall: No mass, lacerations, deformity, swelling, tenderness, crepitus or edema. There is no dullness to percussion.  Breasts:    Breasts are symmetrical.     Right: Normal. No swelling, bleeding, inverted nipple, mass, nipple discharge, skin change or tenderness.     Left: Normal. No swelling, bleeding, inverted nipple, mass, nipple discharge, skin change or tenderness.     Comments: Well healed scar in the upper outer quadrant of right breast.   No masses and the left breast had moderate fibrocystic changes with severe tenderness.  Abdominal:     General: Abdomen is protuberant. Bowel sounds are normal. There is no distension.     Palpations: Abdomen is soft. There is no mass.     Tenderness: There is no abdominal  tenderness. There is no right CVA tenderness, left CVA tenderness, guarding or rebound.     Hernia: No hernia is present.     Comments: Mildly distended, soft and non tender  Musculoskeletal:        General: No swelling, tenderness, deformity or signs of injury. Normal range of motion.     Cervical back: Normal range of motion and neck supple. No rigidity or tenderness.     Right lower leg: No edema.     Left lower leg: No edema.  Lymphadenopathy:     Cervical: No cervical adenopathy.     Upper Body:     Right  upper body: No supraclavicular, axillary or pectoral adenopathy.     Left upper body: No supraclavicular, axillary or pectoral adenopathy.  Skin:    General: Skin is warm and dry.     Capillary Refill: Capillary refill takes less than 2 seconds.     Coloration: Skin is not jaundiced or pale.     Findings: No bruising, erythema, lesion or rash.     Comments: She has a raised scaly keratotic lesion in her chest on the central sternum  Neurological:     General: No focal deficit present.     Mental Status: She is alert and oriented to person, place, and time. Mental status is at baseline.     Cranial Nerves: No cranial nerve deficit.     Sensory: No sensory deficit.     Motor: No weakness.     Coordination: Coordination normal.     Gait: Gait normal.     Deep Tendon Reflexes: Reflexes normal.  Psychiatric:        Mood and Affect: Mood normal.        Behavior: Behavior normal.        Thought Content: Thought content normal.        Judgment: Judgment normal.     LABS:      Latest Ref Rng & Units 09/08/2022   11:19 AM 03/05/2022   10:44 AM 09/02/2021   12:00 AM  CBC  WBC 4.0 - 10.5 K/uL 8.1  5.9  6.6      Hemoglobin 12.0 - 15.0 g/dL 86.5  78.4  69.6      Hematocrit 36.0 - 46.0 % 42.4  40.5  39      Platelets 150 - 400 K/uL 277  268  267         This result is from an external source.      Latest Ref Rng & Units 09/08/2022   11:19 AM 03/05/2022   10:44 AM 09/02/2021    12:00 AM  CMP  Glucose 70 - 99 mg/dL 295  284    BUN 8 - 23 mg/dL 14  16  18       Creatinine 0.44 - 1.00 mg/dL 1.32  4.40  0.8      Sodium 135 - 145 mmol/L 138  137  136      Potassium 3.5 - 5.1 mmol/L 3.9  4.2  4.2      Chloride 98 - 111 mmol/L 100  104  104      CO2 22 - 32 mmol/L 26  23  24       Calcium 8.9 - 10.3 mg/dL 9.5  9.1  9.5      Total Protein 6.5 - 8.1 g/dL 7.0  6.7    Total Bilirubin 0.3 - 1.2 mg/dL 0.5  0.5    Alkaline Phos 38 - 126 U/L 55  49  65      AST 15 - 41 U/L 21  22  22       ALT 0 - 44 U/L 15  17  17          This result is from an external source.   No results found for: "CEA1", "CEA" / No results found for: "CEA1", "CEA" No results found for: "PSA1" No results found for: "NUU725" No results found for: "CAN125"  No results found for: "TOTALPROTELP", "ALBUMINELP", "A1GS", "A2GS", "BETS", "BETA2SER", "GAMS", "MSPIKE", "SPEI" No results found for: "TIBC", "FERRITIN", "IRONPCTSAT" No results found for: "LDH"  STUDIES:  HISTORY:   Past Medical History:  Diagnosis Date   Anxiety    Arthritis    Cancer of overlapping sites of right female breast (HCC) 10/19/2017   Complication of anesthesia    "became unresposive after surgery"- 2017 , aspirated after surgery and had pneumonia- 2017    Depression    Diabetes mellitus without complication (HCC)    "on the borderline"   Diverticulitis    Family history of adverse reaction to anesthesia    mother slow to wake up afgter 5 days, sister had port placed and had issues - went into coma but was very sick    GERD (gastroesophageal reflux disease)    Headache    Pneumonia    PONV (postoperative nausea and vomiting)    Prolapsed uterus    and bladder    Past Surgical History:  Procedure Laterality Date   BREAST LUMPECTOMY WITH RADIOACTIVE SEED AND SENTINEL LYMPH NODE BIOPSY Right 10/19/2017   Procedure: INJECT BLUE DYE RIGHT BREAST AND RIGHT BREAST LUMPECTOMY WITH TWO RADIOACTIVE SEEDS AND  RIGHT AXILLARY SENTINEL LYMPH NODE BIOPSY;  Surgeon: Claud Kelp, MD;  Location: [facility] OR;  Service: General;  Laterality: Right;   COLON SURGERY     HERNIA REPAIR     PORTACATH PLACEMENT Left 12/07/2017   Procedure: INSERTION PORT-A-CATH WITH ULTRA SOUND;  Surgeon: Claud Kelp, MD;  Location: [facility] ORS;  Service: General;  Laterality: Left;    Family History  Problem Relation Age of Onset   Osteoporosis Mother    Heart disease Father     Social History:  reports that she has never smoked. She has never used smokeless tobacco. She reports that she does not drink alcohol and does not use drugs.The patient is alone  today.  Allergies:  Allergies  Allergen Reactions   Other     Other reaction(s): Other (See Comments) Pt states medication after sx made her "unresponsive", "sounds like oxycodone but can't remember the name"   Codeine Rash   Hydrocodone Rash    Current Medications: Current Outpatient Medications  Medication Sig Dispense Refill   anastrozole (ARIMIDEX) 1 MG tablet Take 1 tablet (1 mg total) by mouth daily. 90 tablet 3   Calcium-Vitamin D-Vitamin K 650-12.5-40 MG-MCG-MCG CHEW Chew 1,300 mg by mouth daily.     Multiple Vitamin (MULTI-VITAMIN) tablet Take 2 tablets by mouth daily.     pantoprazole (PROTONIX) 40 MG tablet Take 1 tablet by mouth daily.     rizatriptan (MAXALT) 10 MG tablet Take 5-10 mg by mouth 2 (two) times daily as needed for migraine. May repeat in 2 hours if needed     simvastatin (ZOCOR) 20 MG tablet Take 20 mg by mouth at bedtime.     traZODone (DESYREL) 50 MG tablet Take 1 tablet (50 mg total) by mouth at bedtime. 90 tablet 3   venlafaxine XR (EFFEXOR-XR) 75 MG 24 hr capsule Take 75 mg by mouth daily with breakfast.     No current facility-administered medications for this visit.     ASSESSMENT & PLAN:   Assessment:  Sheena Joyce is a 77 y.o. female with:  1. Stage IIA breast cancer.  She remains without evidence of recurrence, now 5 years  out.  She will continue anastrozole as long as we can treat her osteoporosis. She has already completed 4 and a half years.  However I pulled out her original Endopredict report from September of 2019.  This was high risk with an EpClin score of 3.8  for a 15% risk of distant recurrence in the next 10 years.  Her absolute chemotherapy benefit was 6%.  Her likelihood of a late distant recurrence in years 5-15 was 12%, which I consider fairly high.  We will therefore continue consider treating her with extended adjuvant hormonal therapy for 7 to 10 years total if we can control her osteoporosis and she has no other toxicities.  2. History of subsegmental pulmonary embolism in October 2019.  She treated with enoxaparin, then rivaroxaban.  Anticoagulation was discontinued in February 2020. She does not have symptoms suggestive of recurrence.  3. Osteoporosis, for which she is receiving Prolia injections every 6 months as of May 2020.  She had significant bone pain associated with this, but started Claritin and did not have as much bone pain with her next injection.    4. History of COVID 19 in November of 2022.    Plan: She has no evidence of recurrent breast cancer but has several troubling symptoms today. She gets nausea when she takes her medications and I advised her to take the medication with food. She has a loss of appetite. She also complains of bloating and abdominal swelling, she has no change in bowel movements. I ordered an abdominal and pelvic CT scan today. There is no family history of ovarian cancer but I advised her to see the gynecologist to check her ovaries and uterus since it has been at least 3-4 years. She is due for her Prolia injection this week. Her last DEXA was in January 2024 and it revealed stable osteoporosis and osteopenia in her arms. I will schedule her next mammogram in October, 2024.  I will see her  back in 6 months with CBC and CMP. She verbalizes understanding of and  agreement to the plans discussed today. She knows to call the office should any new questions or concerns arise.    I provided 30 minutes of face-to-face time during this this encounter and > 50% was spent counseling as documented under my assessment and plan.     I,Oluwatobi Asade,acting as a scribe for Dellia Beckwith, MD.,have documented all relevant documentation on the behalf of Dellia Beckwith, MD,as directed by  Dellia Beckwith, MD while in the presence of Dellia Beckwith, MD.    Dellia Beckwith, MD

## 2022-09-08 ENCOUNTER — Inpatient Hospital Stay: Payer: HMO | Attending: Oncology | Admitting: Oncology

## 2022-09-08 ENCOUNTER — Other Ambulatory Visit: Payer: Self-pay | Admitting: Oncology

## 2022-09-08 ENCOUNTER — Encounter: Payer: Self-pay | Admitting: Oncology

## 2022-09-08 ENCOUNTER — Inpatient Hospital Stay: Payer: HMO

## 2022-09-08 VITALS — BP 114/96 | HR 85 | Temp 97.8°F | Resp 18 | Ht 59.0 in | Wt 166.3 lb

## 2022-09-08 DIAGNOSIS — C50211 Malignant neoplasm of upper-inner quadrant of right female breast: Secondary | ICD-10-CM | POA: Diagnosis not present

## 2022-09-08 DIAGNOSIS — Z170 Estrogen receptor positive status [ER+]: Secondary | ICD-10-CM

## 2022-09-08 DIAGNOSIS — M810 Age-related osteoporosis without current pathological fracture: Secondary | ICD-10-CM | POA: Insufficient documentation

## 2022-09-08 DIAGNOSIS — C50811 Malignant neoplasm of overlapping sites of right female breast: Secondary | ICD-10-CM

## 2022-09-08 DIAGNOSIS — R140 Abdominal distension (gaseous): Secondary | ICD-10-CM

## 2022-09-08 LAB — CMP (CANCER CENTER ONLY)
ALT: 15 U/L (ref 0–44)
AST: 21 U/L (ref 15–41)
Albumin: 3.9 g/dL (ref 3.5–5.0)
Alkaline Phosphatase: 55 U/L (ref 38–126)
Anion gap: 12 (ref 5–15)
BUN: 14 mg/dL (ref 8–23)
CO2: 26 mmol/L (ref 22–32)
Calcium: 9.5 mg/dL (ref 8.9–10.3)
Chloride: 100 mmol/L (ref 98–111)
Creatinine: 1.01 mg/dL — ABNORMAL HIGH (ref 0.44–1.00)
GFR, Estimated: 57 mL/min — ABNORMAL LOW (ref >60)
Glucose, Bld: 105 mg/dL — ABNORMAL HIGH (ref 70–99)
Potassium: 3.9 mmol/L (ref 3.5–5.1)
Sodium: 138 mmol/L (ref 135–145)
Total Bilirubin: 0.5 mg/dL (ref 0.3–1.2)
Total Protein: 7 g/dL (ref 6.5–8.1)

## 2022-09-08 LAB — CBC WITH DIFFERENTIAL (CANCER CENTER ONLY)
Abs Immature Granulocytes: 0.03 10*3/uL (ref 0.00–0.07)
Basophils Absolute: 0.1 10*3/uL (ref 0.0–0.1)
Basophils Relative: 1 %
Eosinophils Absolute: 0.2 10*3/uL (ref 0.0–0.5)
Eosinophils Relative: 3 %
HCT: 42.4 % (ref 36.0–46.0)
Hemoglobin: 13.3 g/dL (ref 12.0–15.0)
Immature Granulocytes: 0 %
Lymphocytes Relative: 30 %
Lymphs Abs: 2.4 10*3/uL (ref 0.7–4.0)
MCH: 29.4 pg (ref 26.0–34.0)
MCHC: 31.4 g/dL (ref 30.0–36.0)
MCV: 93.6 fL (ref 80.0–100.0)
Monocytes Absolute: 0.7 10*3/uL (ref 0.1–1.0)
Monocytes Relative: 9 %
Neutro Abs: 4.6 10*3/uL (ref 1.7–7.7)
Neutrophils Relative %: 57 %
Platelet Count: 277 10*3/uL (ref 150–400)
RBC: 4.53 MIL/uL (ref 3.87–5.11)
RDW: 13.1 % (ref 11.5–15.5)
WBC Count: 8.1 10*3/uL (ref 4.0–10.5)
nRBC: 0 % (ref 0.0–0.2)

## 2022-09-09 ENCOUNTER — Telehealth: Payer: Self-pay | Admitting: Oncology

## 2022-09-09 ENCOUNTER — Other Ambulatory Visit: Payer: Self-pay

## 2022-09-09 ENCOUNTER — Telehealth: Payer: Self-pay

## 2022-09-09 ENCOUNTER — Encounter: Payer: Self-pay | Admitting: Oncology

## 2022-09-09 DIAGNOSIS — Z170 Estrogen receptor positive status [ER+]: Secondary | ICD-10-CM

## 2022-09-09 NOTE — Telephone Encounter (Signed)
-----   Message from Dellia Beckwith sent at 09/08/2022  1:44 PM EDT ----- Regarding: refer Refer to Dr. Leota Jacobsen, she is his pt but hasn't been seen in years.  I am concerned about abdominal bloating, nausea, decreased appetite and weight loss.  I am ordering CT

## 2022-09-09 NOTE — Telephone Encounter (Signed)
Patient notified of lab results

## 2022-09-09 NOTE — Telephone Encounter (Signed)
Contacted pt to schedule an appt and notify of upcoming scans. Unable to reach via phone, voicemail was left.  Scheduling Message Entered by Gery Pray H on 09/08/2022 at 12:12 PM Priority: Routine <No visit type provided>  Department: [facility]  Provider:  Scheduling Notes:  Pls sched CT abd/pelvis to eval nausea, bloating, weight loss    Due for bilat screening mammo in October -do 2nd week    RT 6 months with labs and then Prolia injection

## 2022-09-09 NOTE — Telephone Encounter (Signed)
-----   Message from Susan M Phy sent at 09/08/2022  4:42 PM EDT ----- Regarding: RE: labs CMP back and looks good.  Calcium = 9.5 and creatinine = 1.01.  ----- Message ----- From: McCarty, Christine H, MD Sent: 09/08/2022   7:27 AM EDT To: Susan M Phy, RPH; Shawnie Nicole M Jadelynn Boylan, LPN Subject: labs                                           Labs today, help me to look for results before injection on Friday   

## 2022-09-09 NOTE — Telephone Encounter (Signed)
-----   Message from Georga Hacking Phy sent at 09/08/2022  4:42 PM EDT ----- Regarding: RE: labs CMP back and looks good.  Calcium = 9.5 and creatinine = 1.01.  ----- Message ----- From: Dellia Beckwith, MD Sent: 09/08/2022   7:27 AM EDT To: Domenic Schwab, RPH; Jeannette Corpus, LPN Subject: labs                                           Labs today, help me to look for results before injection on Friday

## 2022-09-09 NOTE — Addendum Note (Signed)
Addended by: Domenic Schwab on: 09/09/2022 02:30 PM   Modules accepted: Orders

## 2022-09-09 NOTE — Telephone Encounter (Signed)
Attempted to contact patient. No answer and no VM.  

## 2022-09-09 NOTE — Telephone Encounter (Signed)
Referral sent via fax.

## 2022-09-10 ENCOUNTER — Telehealth: Payer: Self-pay | Admitting: Oncology

## 2022-09-10 ENCOUNTER — Inpatient Hospital Stay: Payer: HMO

## 2022-09-10 NOTE — Telephone Encounter (Signed)
09/10/22 PATIENT RESCHEDULED APPT DUE TO RIB INJURY

## 2022-09-14 ENCOUNTER — Encounter: Payer: Self-pay | Admitting: Oncology

## 2022-09-14 NOTE — Addendum Note (Signed)
Addended by: Domenic Schwab on: 09/14/2022 04:52 PM   Modules accepted: Orders

## 2022-09-15 ENCOUNTER — Inpatient Hospital Stay: Payer: HMO

## 2022-09-15 VITALS — BP 122/79 | HR 84 | Temp 98.4°F | Wt 168.1 lb

## 2022-09-15 DIAGNOSIS — R140 Abdominal distension (gaseous): Secondary | ICD-10-CM | POA: Diagnosis not present

## 2022-09-15 DIAGNOSIS — I714 Abdominal aortic aneurysm, without rupture, unspecified: Secondary | ICD-10-CM | POA: Diagnosis not present

## 2022-09-15 DIAGNOSIS — M810 Age-related osteoporosis without current pathological fracture: Secondary | ICD-10-CM

## 2022-09-15 DIAGNOSIS — K5730 Diverticulosis of large intestine without perforation or abscess without bleeding: Secondary | ICD-10-CM | POA: Diagnosis not present

## 2022-09-15 DIAGNOSIS — N200 Calculus of kidney: Secondary | ICD-10-CM | POA: Diagnosis not present

## 2022-09-15 DIAGNOSIS — C50911 Malignant neoplasm of unspecified site of right female breast: Secondary | ICD-10-CM | POA: Diagnosis not present

## 2022-09-15 DIAGNOSIS — Z87442 Personal history of urinary calculi: Secondary | ICD-10-CM | POA: Diagnosis not present

## 2022-09-15 MED ORDER — DENOSUMAB 60 MG/ML ~~LOC~~ SOSY
60.0000 mg | PREFILLED_SYRINGE | Freq: Once | SUBCUTANEOUS | Status: AC
  Administered 2022-09-15: 60 mg via SUBCUTANEOUS
  Filled 2022-09-15: qty 1

## 2022-09-15 NOTE — Patient Instructions (Signed)
Denosumab Injection (Osteoporosis) What is this medication? DENOSUMAB (den oh SUE mab) prevents and treats osteoporosis. It works by making your bones stronger and less likely to break (fracture). It is a monoclonal antibody. This medicine may be used for other purposes; ask your health care provider or pharmacist if you have questions. COMMON BRAND NAME(S): Prolia What should I tell my care team before I take this medication? They need to know if you have any of these conditions: Dental or gum disease Had thyroid or parathyroid (glands located in neck) surgery Having dental surgery or a tooth pulled Kidney disease Low levels of calcium in the blood On dialysis Poor nutrition Thyroid disease Trouble absorbing nutrients from your food An unusual or allergic reaction to denosumab, other medications, foods, dyes, or preservatives Pregnant or trying to get pregnant Breastfeeding How should I use this medication? This medication is injected under the skin. It is given by your care team in a hospital or clinic setting. A special MedGuide will be given to you before each treatment. Be sure to read this information carefully each time. Talk to your care team about the use of this medication in children. Special care may be needed. Overdosage: If you think you have taken too much of this medicine contact a poison control center or emergency room at once. NOTE: This medicine is only for you. Do not share this medicine with others. What if I miss a dose? Keep appointments for follow-up doses. It is important not to miss your dose. Call your care team if you are unable to keep an appointment. What may interact with this medication? Do not take this medication with any of the following: Other medications that contain denosumab This medication may also interact with the following: Medications that lower your chance of fighting infection Steroid medications, such as prednisone or cortisone This  list may not describe all possible interactions. Give your health care provider a list of all the medicines, herbs, non-prescription drugs, or dietary supplements you use. Also tell them if you smoke, drink alcohol, or use illegal drugs. Some items may interact with your medicine. What should I watch for while using this medication? Your condition will be monitored carefully while you are receiving this medication. You may need blood work done while taking this medication. This medication may increase your risk of getting an infection. Call your care team for advice if you get a fever, chills, sore throat, or other symptoms of a cold or flu. Do not treat yourself. Try to avoid being around people who are sick. Tell your dentist and dental surgeon that you are taking this medication. You should not have major dental surgery while on this medication. See your dentist to have a dental exam and fix any dental problems before starting this medication. Take good care of your teeth while on this medication. Make sure you see your dentist for regular follow-up appointments. This medication may cause low levels of calcium in your body. The risk of severe side effects is increased in people with kidney disease. Your care team may prescribe calcium and vitamin D to help prevent low calcium levels while you take this medication. It is important to take calcium and vitamin D as directed by your care team. Talk to your care team if you may be pregnant. Serious birth defects may occur if you take this medication during pregnancy and for 5 months after the last dose. You will need a negative pregnancy test before starting this medication. Contraception   is recommended while taking this medication and for 5 months after the last dose. Your care team can help you find the option that works for you. Talk to your care team before breastfeeding. Changes to your treatment plan may be needed. What side effects may I notice from  receiving this medication? Side effects that you should report to your care team as soon as possible: Allergic reactions--skin rash, itching, hives, swelling of the face, lips, tongue, or throat Infection--fever, chills, cough, sore throat, wounds that don't heal, pain or trouble when passing urine, general feeling of discomfort or being unwell Low calcium level--muscle pain or cramps, confusion, tingling, or numbness in the hands or feet Osteonecrosis of the jaw--pain, swelling, or redness in the mouth, numbness of the jaw, poor healing after dental work, unusual discharge from the mouth, visible bones in the mouth Severe bone, joint, or muscle pain Skin infection--skin redness, swelling, warmth, or pain Side effects that usually do not require medical attention (report these to your care team if they continue or are bothersome): Back pain Headache Joint pain Muscle pain Pain in the hands, arms, legs, or feet Runny or stuffy nose Sore throat This list may not describe all possible side effects. Call your doctor for medical advice about side effects. You may report side effects to FDA at 1-800-FDA-1088. Where should I keep my medication? This medication is given in a hospital or clinic. It will not be stored at home. NOTE: This sheet is a summary. It may not cover all possible information. If you have questions about this medicine, talk to your doctor, pharmacist, or health care provider.  2024 Elsevier/Gold Standard (2022-03-23 00:00:00)  

## 2022-09-28 ENCOUNTER — Telehealth: Payer: Self-pay

## 2022-09-28 NOTE — Telephone Encounter (Signed)
Patient called states she had an Ultrasound of her ABD on 09/15/22 and has not heard anything from it. I only see that there was a CT ABD/PEL done that day.

## 2022-10-02 ENCOUNTER — Encounter: Payer: Self-pay | Admitting: Oncology

## 2022-10-06 DIAGNOSIS — N816 Rectocele: Secondary | ICD-10-CM | POA: Diagnosis not present

## 2022-10-06 DIAGNOSIS — N3946 Mixed incontinence: Secondary | ICD-10-CM | POA: Diagnosis not present

## 2022-10-06 DIAGNOSIS — N763 Subacute and chronic vulvitis: Secondary | ICD-10-CM | POA: Diagnosis not present

## 2022-10-19 ENCOUNTER — Other Ambulatory Visit: Payer: Self-pay | Admitting: Hematology and Oncology

## 2022-10-19 MED ORDER — ANASTROZOLE 1 MG PO TABS: 1.0000 mg | ORAL_TABLET | Freq: Every day | ORAL | 3 refills | Status: DC

## 2022-10-27 ENCOUNTER — Encounter: Payer: Self-pay | Admitting: Oncology

## 2022-10-29 ENCOUNTER — Other Ambulatory Visit: Payer: Self-pay

## 2022-11-05 ENCOUNTER — Other Ambulatory Visit: Payer: Self-pay | Admitting: Oncology

## 2022-11-05 DIAGNOSIS — F32A Depression, unspecified: Secondary | ICD-10-CM

## 2022-12-09 DIAGNOSIS — Z1231 Encounter for screening mammogram for malignant neoplasm of breast: Secondary | ICD-10-CM | POA: Diagnosis not present

## 2022-12-09 DIAGNOSIS — Z853 Personal history of malignant neoplasm of breast: Secondary | ICD-10-CM | POA: Diagnosis not present

## 2022-12-09 LAB — HM MAMMOGRAPHY

## 2022-12-16 DIAGNOSIS — M47812 Spondylosis without myelopathy or radiculopathy, cervical region: Secondary | ICD-10-CM | POA: Diagnosis not present

## 2022-12-30 ENCOUNTER — Encounter: Payer: Self-pay | Admitting: Oncology

## 2023-01-14 DIAGNOSIS — M47812 Spondylosis without myelopathy or radiculopathy, cervical region: Secondary | ICD-10-CM | POA: Diagnosis not present

## 2023-02-07 DIAGNOSIS — M810 Age-related osteoporosis without current pathological fracture: Secondary | ICD-10-CM | POA: Diagnosis not present

## 2023-02-07 DIAGNOSIS — E785 Hyperlipidemia, unspecified: Secondary | ICD-10-CM | POA: Diagnosis not present

## 2023-02-07 DIAGNOSIS — G43709 Chronic migraine without aura, not intractable, without status migrainosus: Secondary | ICD-10-CM | POA: Diagnosis not present

## 2023-02-07 DIAGNOSIS — K580 Irritable bowel syndrome with diarrhea: Secondary | ICD-10-CM | POA: Diagnosis not present

## 2023-02-07 DIAGNOSIS — N1831 Chronic kidney disease, stage 3a: Secondary | ICD-10-CM | POA: Diagnosis not present

## 2023-02-07 DIAGNOSIS — M4722 Other spondylosis with radiculopathy, cervical region: Secondary | ICD-10-CM | POA: Diagnosis not present

## 2023-02-07 DIAGNOSIS — Z79899 Other long term (current) drug therapy: Secondary | ICD-10-CM | POA: Diagnosis not present

## 2023-02-07 DIAGNOSIS — E1122 Type 2 diabetes mellitus with diabetic chronic kidney disease: Secondary | ICD-10-CM | POA: Diagnosis not present

## 2023-02-07 DIAGNOSIS — F331 Major depressive disorder, recurrent, moderate: Secondary | ICD-10-CM | POA: Diagnosis not present

## 2023-02-07 DIAGNOSIS — C50911 Malignant neoplasm of unspecified site of right female breast: Secondary | ICD-10-CM | POA: Diagnosis not present

## 2023-02-07 DIAGNOSIS — Z0000 Encounter for general adult medical examination without abnormal findings: Secondary | ICD-10-CM | POA: Diagnosis not present

## 2023-02-07 DIAGNOSIS — K2960 Other gastritis without bleeding: Secondary | ICD-10-CM | POA: Diagnosis not present

## 2023-03-01 DIAGNOSIS — H903 Sensorineural hearing loss, bilateral: Secondary | ICD-10-CM | POA: Diagnosis not present

## 2023-03-08 DIAGNOSIS — E119 Type 2 diabetes mellitus without complications: Secondary | ICD-10-CM | POA: Diagnosis not present

## 2023-03-08 DIAGNOSIS — H524 Presbyopia: Secondary | ICD-10-CM | POA: Diagnosis not present

## 2023-03-08 DIAGNOSIS — Z961 Presence of intraocular lens: Secondary | ICD-10-CM | POA: Diagnosis not present

## 2023-03-08 DIAGNOSIS — H43813 Vitreous degeneration, bilateral: Secondary | ICD-10-CM | POA: Diagnosis not present

## 2023-03-08 DIAGNOSIS — Z7984 Long term (current) use of oral hypoglycemic drugs: Secondary | ICD-10-CM | POA: Diagnosis not present

## 2023-03-08 DIAGNOSIS — L281 Prurigo nodularis: Secondary | ICD-10-CM | POA: Diagnosis not present

## 2023-03-08 DIAGNOSIS — L820 Inflamed seborrheic keratosis: Secondary | ICD-10-CM | POA: Diagnosis not present

## 2023-03-10 NOTE — Progress Notes (Incomplete)
 [facility]  [address] [place],  [place]  [address] [phone]  Clinic Day: 03/10/23   Referring physician: Rusty, Lonni HERO, *  CHIEF COMPLAINT:  CC: Stage IIA (T2 N31mic M0) hormone positive breast cancer and osteoporosis  Current Treatment:  Anastrazole and Prolia   HISTORY OF PRESENT ILLNESS:  Sheena Joyce is a 78 y.o. female with a history of stage IIA (T2 N4mic M0) hormone positive breast cancer diagnosed in July 2019.  Her history dates back to May 2019 when a breast cancer was discovered on a screening mammogram, which revealed a possible mass in the right breast.  Diagnostic right mammogram revealed a mass in the superior central right breast, which measure 1.3 cm on ultrasound.  There was also a second similar mass measuring 1.1 cm, as well as borderline cortical thickening of a single right axilla.  Biopsies of both lesions were done.  The first revealed a grade 1, invasive lobular carcinoma along with lobular carcinoma in situ and the other biopsy site revealed a 5 mm focus of proliferative fibrocystic changes.  Estrogen and progesterone receptors were highly positive and HER 2 negative.  Ki 67 was 1%.  MRI  breast confirmed multifocal disease with adjacent enhancing masses in the upper inner quadrant of the right breast.  No other malignancy was found in either breast and no pathologic lymphadenopathy was seen.  She was treated with right breast lumpectomy in August 2019.  Pathology revealed a 4.5 cm, grade 2, invasive lobular carcinoma with lobular carcinoma in situ.  Two lymph nodes had micrometastases and 2 lymph nodes were negative for metastasis.  Margins were clear.  No lymphovascular or perineural invasion was seen.  EndoPredict testing revealed a high risk recurrence score.  Due to her personal and family history of malignancy, she underwent testing for hereditary cancer syndromes with the Myriad myRisk hereditary cancer panel test.  This  did not reveal any clinically significant mutation or variant of uncertain significance.  She was recommended for adjuvant chemotherapy with docetaxel /cyclophosphamide  due to her high risk EndoPredict score.  She had difficulty tolerating treatment due to severe bone pain and headaches.  She then developed a subsegmental right lower lobe pulmonary embolism in October 2019.  She was initially treated with enoxaparin  1 milligram/kilogram twice daily for 1 month, then 1.5 milligrams/kilogram daily.  She was switched to rivaroxaban in December.  She completed 4 cycles of docetaxel /cyclophosphamide  in December 2019. Due to toxicities, the chemotherapy doses were decreased by 20% with her 2nd cycle.  She required IV fluid support and pain medication throughout chemotherapy.   Bone density scan in February 2020 revealed osteoporosis, with a T-score of -2.2 of the spine for osteopenia and -2.6 for the right femur.  We really do not have a comparison study since 2012 and she had osteoporosis at that time, so this not a new problem, but has gone untreated.  She did not tolerate alendronate previously.  She is on calcium with vitamin-D 1500 mg daily.  She was started on anastrozole  1 mg daily at the end of January 2020.  Since completion of chemotherapy, the patient has complained of cough.  We recommended treatment of the osteoporosis with Prolia  and she started that in May 2020 and had an increase in bone pain.  She continued to complain of bone pain in August.  She was seen in November 2020 prior to 2nd dose of Prolia , at which time she reported back pain.  MRI through her  orthopedic office apparently revealed degenerative disc changes.  She also reported chronic nonproductive cough.  We recommended she continue pantoprazole 40 mg daily, as we felt acid reflux could be contributing to her cough.Annual bilateral mammogram in August of 2022 was clear.  She had COVID-19 in November 2022.  A bone density scan in June 2022  still shows osteoporosis of the femur with a T score stable at -2.6.  The spine shows improvement from a T score of -2.2 down to -1.4.  INTERVAL HISTORY:  Sheena Joyce is here for 4 month evaluation for stage IIA (T2 N16mic M0) hormone positive breast cancer and osteoporosis. Patient states that she feels *** and ***.       She denies signs of infection such as sore throat, sinus drainage, cough, or urinary symptoms.  She denies fevers or recurrent chills. She denies pain. She denies nausea, vomiting, chest pain, dyspnea or cough. Her appetite is *** and her weight {Weight change:10426}.     She continues her anastrazole since January of 2020 without significant difficulty. She is widowed last year after 57 years of marriage, and he had dementia at the end. She is still grieving and does complain of depression which has been chronic. Patient states that She doesn't feel too good and complains of nausea, weakness and constant dizziness. She gets the nausea when she takes her medications and I advised her to take the medication with food. She has a loss of appetite, and has lost weight. She also complains of bloating and abdominal swelling, she has no change in bowel movements. I ordered an abdominal and pelvic CT scan today. There is no family history of ovarian cancer but I advised her to see the gynecologist to check her ovaries and uterus since it has been at least 3-4 years. She is due for her Prolia  injection this week. Her last DEXA was in January 2024 and it revealed stable osteoporosis and osteopenia in her arms. I will schedule her next mammogram in October, 2024.  I will see her  back in 6 months with CBC and CMP. She  denies signs of infections such as sore throat, sinus drainage, cough or urinary symptoms. She  denies fever or recurrent chills. She  also deny vomiting, chest pain dyspnea or cough. Her  appetite is not good and Her  weight has decreased 7 pounds over last 6 months     REVIEW OF  SYSTEMS:  Review of Systems  Constitutional:  Positive for fatigue and unexpected weight change. Negative for appetite change, chills, diaphoresis and fever.  HENT:   Negative for hearing loss, lump/mass, mouth sores, nosebleeds, sore throat, tinnitus, trouble swallowing and voice change.        Alopecia  Eyes: Negative.  Negative for eye problems and icterus.  Respiratory: Negative.  Negative for chest tightness, cough, hemoptysis, shortness of breath and wheezing.   Cardiovascular: Negative.  Negative for chest pain, leg swelling and palpitations.  Gastrointestinal:  Positive for nausea. Negative for abdominal distention, abdominal pain, blood in stool, constipation, diarrhea, rectal pain and vomiting.       Abdominal bloating  Endocrine: Positive for hot flashes.  Genitourinary: Negative.  Negative for bladder incontinence, difficulty urinating, dyspareunia, dysuria, frequency, hematuria, menstrual problem, nocturia, pelvic pain, vaginal bleeding and vaginal discharge.   Musculoskeletal: Negative.  Negative for arthralgias, back pain, flank pain, gait problem, myalgias, neck pain and neck stiffness.  Skin: Negative.  Negative for itching, rash and wound.  Neurological: Negative.  Negative for  dizziness, extremity weakness, gait problem, headaches, light-headedness, numbness, seizures and speech difficulty.  Hematological: Negative.  Negative for adenopathy. Does not bruise/bleed easily.  Psychiatric/Behavioral:  Positive for depression. Negative for confusion, decreased concentration, sleep disturbance and suicidal ideas. The patient is not nervous/anxious.   All other systems reviewed and are negative.    VITALS:  There were no vitals taken for this visit.  Wt Readings from Last 3 Encounters:  09/15/22 168 lb 1.9 oz (76.3 kg)  09/08/22 166 lb 4.8 oz (75.4 kg)  03/08/22 173 lb (78.5 kg)    There is no height or weight on file to calculate BMI.  Performance status (ECOG): 1 -  Symptomatic but completely ambulatory  PHYSICAL EXAM:  Physical Exam Vitals and nursing note reviewed.  Constitutional:      General: She is not in acute distress.    Appearance: Normal appearance. She is normal weight. She is not ill-appearing, toxic-appearing or diaphoretic.  HENT:     Head: Normocephalic and atraumatic.     Right Ear: Tympanic membrane, ear canal and external ear normal. There is no impacted cerumen.     Left Ear: Tympanic membrane, ear canal and external ear normal. There is no impacted cerumen.     Nose: Nose normal. No congestion or rhinorrhea.     Mouth/Throat:     Mouth: Mucous membranes are moist.     Pharynx: Oropharynx is clear. No oropharyngeal exudate or posterior oropharyngeal erythema.  Eyes:     General: No scleral icterus.       Right eye: No discharge.        Left eye: No discharge.     Extraocular Movements: Extraocular movements intact.     Conjunctiva/sclera: Conjunctivae normal.     Pupils: Pupils are equal, round, and reactive to light.  Neck:     Vascular: No carotid bruit.  Cardiovascular:     Rate and Rhythm: Normal rate and regular rhythm.     Pulses: Normal pulses.     Heart sounds: Normal heart sounds. No murmur heard.    No friction rub. No gallop.  Pulmonary:     Effort: Pulmonary effort is normal. No respiratory distress.     Breath sounds: Normal breath sounds. No stridor. No wheezing, rhonchi or rales.  Chest:     Chest wall: No mass, lacerations, deformity, swelling, tenderness, crepitus or edema. There is no dullness to percussion.  Breasts:    Breasts are symmetrical.     Right: Normal. No swelling, bleeding, inverted nipple, mass, nipple discharge, skin change or tenderness.     Left: Normal. No swelling, bleeding, inverted nipple, mass, nipple discharge, skin change or tenderness.     Comments: Well healed scar in the upper outer quadrant of right breast.   No masses and the left breast had moderate fibrocystic changes  with severe tenderness.  Abdominal:     General: Abdomen is protuberant. Bowel sounds are normal. There is no distension.     Palpations: Abdomen is soft. There is no mass.     Tenderness: There is no abdominal tenderness. There is no right CVA tenderness, left CVA tenderness, guarding or rebound.     Hernia: No hernia is present.     Comments: Mildly distended, soft and non tender  Musculoskeletal:        General: No swelling, tenderness, deformity or signs of injury. Normal range of motion.     Cervical back: Normal range of motion and neck supple. No rigidity or  tenderness.     Right lower leg: No edema.     Left lower leg: No edema.  Lymphadenopathy:     Cervical: No cervical adenopathy.     Upper Body:     Right upper body: No supraclavicular, axillary or pectoral adenopathy.     Left upper body: No supraclavicular, axillary or pectoral adenopathy.  Skin:    General: Skin is warm and dry.     Capillary Refill: Capillary refill takes less than 2 seconds.     Coloration: Skin is not jaundiced or pale.     Findings: No bruising, erythema, lesion or rash.     Comments: She has a raised scaly keratotic lesion in her chest on the central sternum  Neurological:     General: No focal deficit present.     Mental Status: She is alert and oriented to person, place, and time. Mental status is at baseline.     Cranial Nerves: No cranial nerve deficit.     Sensory: No sensory deficit.     Motor: No weakness.     Coordination: Coordination normal.     Gait: Gait normal.     Deep Tendon Reflexes: Reflexes normal.  Psychiatric:        Mood and Affect: Mood normal.        Behavior: Behavior normal.        Thought Content: Thought content normal.        Judgment: Judgment normal.     LABS:      Latest Ref Rng & Units 09/08/2022   11:19 AM 03/05/2022   10:44 AM 09/02/2021   12:00 AM  CBC  WBC 4.0 - 10.5 K/uL 8.1  5.9  6.6      Hemoglobin 12.0 - 15.0 g/dL 86.6  87.2  87.3      Hematocrit  36.0 - 46.0 % 42.4  40.5  39      Platelets 150 - 400 K/uL 277  268  267         This result is from an external source.      Latest Ref Rng & Units 09/08/2022   11:19 AM 03/05/2022   10:44 AM 09/02/2021   12:00 AM  CMP  Glucose 70 - 99 mg/dL 894  815    BUN 8 - 23 mg/dL 14  16  18       Creatinine 0.44 - 1.00 mg/dL 8.98  9.07  0.8      Sodium 135 - 145 mmol/L 138  137  136      Potassium 3.5 - 5.1 mmol/L 3.9  4.2  4.2      Chloride 98 - 111 mmol/L 100  104  104      CO2 22 - 32 mmol/L 26  23  24       Calcium 8.9 - 10.3 mg/dL 9.5  9.1  9.5      Total Protein 6.5 - 8.1 g/dL 7.0  6.7    Total Bilirubin 0.3 - 1.2 mg/dL 0.5  0.5    Alkaline Phos 38 - 126 U/L 55  49  65      AST 15 - 41 U/L 21  22  22       ALT 0 - 44 U/L 15  17  17          This result is from an external source.   No results found for: CEA1, CEA / No results found for: CEA1, CEA No results found for: PSA1  No results found for: RJW800 No results found for: CAN125  No results found for: TOTALPROTELP, ALBUMINELP, A1GS, A2GS, BETS, BETA2SER, GAMS, MSPIKE, SPEI No results found for: TIBC, FERRITIN, IRONPCTSAT No results found for: LDH  STUDIES:     HISTORY:   Past Medical History:  Diagnosis Date   Anxiety    Arthritis    Cancer of overlapping sites of right female breast (HCC) 10/19/2017   Complication of anesthesia    became unresposive after surgery- 2017 , aspirated after surgery and had pneumonia- 2017    Depression    Diabetes mellitus without complication (HCC)    on the borderline   Diverticulitis    Family history of adverse reaction to anesthesia    mother slow to wake up afgter 5 days, sister had port placed and had issues - went into coma but was very sick    GERD (gastroesophageal reflux disease)    Headache    Pneumonia    PONV (postoperative nausea and vomiting)    Prolapsed uterus    and bladder    Past Surgical History:  Procedure Laterality  Date   BREAST LUMPECTOMY WITH RADIOACTIVE SEED AND SENTINEL LYMPH NODE BIOPSY Right 10/19/2017   Procedure: INJECT BLUE DYE RIGHT BREAST AND RIGHT BREAST LUMPECTOMY WITH TWO RADIOACTIVE SEEDS AND RIGHT AXILLARY SENTINEL LYMPH NODE BIOPSY;  Surgeon: Gail Favorite, MD;  Location: [facility] OR;  Service: General;  Laterality: Right;   COLON SURGERY     HERNIA REPAIR     PORTACATH PLACEMENT Left 12/07/2017   Procedure: INSERTION PORT-A-CATH WITH ULTRA SOUND;  Surgeon: Gail Favorite, MD;  Location: [facility] ORS;  Service: General;  Laterality: Left;    Family History  Problem Relation Age of Onset   Osteoporosis Mother    Heart disease Father     Social History:  reports that she has never smoked. She has never used smokeless tobacco. She reports that she does not drink alcohol and does not use drugs.The patient is alone  today.  Allergies:  Allergies  Allergen Reactions   Other     Other reaction(s): Other (See Comments) Pt states medication after sx made her unresponsive, sounds like oxycodone  but can't remember the name   Codeine Rash   Hydrocodone  Rash    Current Medications: Current Outpatient Medications  Medication Sig Dispense Refill   anastrozole  (ARIMIDEX ) 1 MG tablet Take 1 tablet (1 mg total) by mouth daily. 90 tablet 3   Calcium-Vitamin D-Vitamin K 650-12.5-40 MG-MCG-MCG CHEW Chew 1,300 mg by mouth daily.     Multiple Vitamin (MULTI-VITAMIN) tablet Take 2 tablets by mouth daily.     pantoprazole (PROTONIX) 40 MG tablet Take 1 tablet by mouth daily.     rizatriptan (MAXALT) 10 MG tablet Take 5-10 mg by mouth 2 (two) times daily as needed for migraine. May repeat in 2 hours if needed     simvastatin (ZOCOR) 20 MG tablet Take 20 mg by mouth at bedtime.     traZODone  (DESYREL ) 50 MG tablet TAKE ONE TABLET BY MOUTH ONCE DAILY AT BEDTIME 90 tablet 5   venlafaxine XR (EFFEXOR-XR) 75 MG 24 hr capsule Take 75 mg by mouth daily with breakfast.     No current facility-administered  medications for this visit.     ASSESSMENT & PLAN:   Assessment:  Sheena Joyce is a 78 y.o. female with:  1. Stage IIA breast cancer.  She remains without evidence of recurrence, now 5 years out.  She will continue anastrozole   as long as we can treat her osteoporosis. She has already completed 4 and a half years.  However I pulled out her original Endopredict report from September of 2019.  This was high risk with an EpClin score of 3.8 for a 15% risk of distant recurrence in the next 10 years.  Her absolute chemotherapy benefit was 6%.  Her likelihood of a late distant recurrence in years 5-15 was 12%, which I consider fairly high.  We will therefore continue consider treating her with extended adjuvant hormonal therapy for 7 to 10 years total if we can control her osteoporosis and she has no other toxicities.  2. History of subsegmental pulmonary embolism in October 2019.  She treated with enoxaparin , then rivaroxaban.  Anticoagulation was discontinued in February 2020. She does not have symptoms suggestive of recurrence.  3. Osteoporosis, for which she is receiving Prolia  injections every 6 months as of May 2020.  She had significant bone pain associated with this, but started Claritin and did not have as much bone pain with her next injection.    4. History of COVID 19 in November of 2022.    Plan: She has no evidence of recurrent breast cancer but has several troubling symptoms today. She gets nausea when she takes her medications and I advised her to take the medication with food. She has a loss of appetite. She also complains of bloating and abdominal swelling, she has no change in bowel movements. I ordered an abdominal and pelvic CT scan today. There is no family history of ovarian cancer but I advised her to see the gynecologist to check her ovaries and uterus since it has been at least 3-4 years. She is due for her Prolia  injection this week. Her last DEXA was in January 2024 and it  revealed stable osteoporosis and osteopenia in her arms. I will schedule her next mammogram in October, 2024.  I will see her  back in 6 months with CBC and CMP. She verbalizes understanding of and agreement to the plans discussed today. She knows to call the office should any new questions or concerns arise.    I provided 16 minutes of face-to-face time during this encounter and > 50% was spent counseling as documented under my assessment and plan.   Wanda VEAR Cornish, MD [facility] - A DEPT OF [facility] [address] [place] [address] Dept: [phone] Dept Fax: [phone]   I,Jasmine M Lassiter,acting as a scribe for Wanda VEAR Cornish, MD.,have documented all relevant documentation on the behalf of Wanda VEAR Cornish, MD,as directed by  Wanda VEAR Cornish, MD while in the presence of Wanda VEAR Cornish, MD.  Rolin HERO Lassiter

## 2023-03-11 ENCOUNTER — Encounter: Payer: Self-pay | Admitting: Oncology

## 2023-03-11 ENCOUNTER — Ambulatory Visit: Payer: HMO

## 2023-03-11 ENCOUNTER — Inpatient Hospital Stay: Payer: HMO | Admitting: Oncology

## 2023-03-11 ENCOUNTER — Inpatient Hospital Stay: Payer: HMO | Attending: Oncology

## 2023-03-11 ENCOUNTER — Other Ambulatory Visit: Payer: HMO

## 2023-03-11 ENCOUNTER — Ambulatory Visit: Payer: HMO | Admitting: Oncology

## 2023-03-11 ENCOUNTER — Inpatient Hospital Stay: Payer: HMO

## 2023-03-11 VITALS — BP 114/73 | HR 81 | Temp 97.6°F | Resp 18 | Ht 59.0 in | Wt 162.7 lb

## 2023-03-11 DIAGNOSIS — M810 Age-related osteoporosis without current pathological fracture: Secondary | ICD-10-CM

## 2023-03-11 DIAGNOSIS — Z170 Estrogen receptor positive status [ER+]: Secondary | ICD-10-CM | POA: Diagnosis not present

## 2023-03-11 DIAGNOSIS — C50111 Malignant neoplasm of central portion of right female breast: Secondary | ICD-10-CM | POA: Insufficient documentation

## 2023-03-11 DIAGNOSIS — C50811 Malignant neoplasm of overlapping sites of right female breast: Secondary | ICD-10-CM | POA: Diagnosis not present

## 2023-03-11 LAB — CMP (CANCER CENTER ONLY)
ALT: 15 U/L (ref 0–44)
AST: 21 U/L (ref 15–41)
Albumin: 4 g/dL (ref 3.5–5.0)
Alkaline Phosphatase: 67 U/L (ref 38–126)
Anion gap: 11 (ref 5–15)
BUN: 12 mg/dL (ref 8–23)
CO2: 24 mmol/L (ref 22–32)
Calcium: 9.2 mg/dL (ref 8.9–10.3)
Chloride: 104 mmol/L (ref 98–111)
Creatinine: 0.97 mg/dL (ref 0.44–1.00)
GFR, Estimated: 59 mL/min — ABNORMAL LOW (ref >60)
Glucose, Bld: 106 mg/dL — ABNORMAL HIGH (ref 70–99)
Potassium: 3.9 mmol/L (ref 3.5–5.1)
Sodium: 140 mmol/L (ref 135–145)
Total Bilirubin: 0.2 mg/dL (ref 0.0–1.2)
Total Protein: 6.3 g/dL — ABNORMAL LOW (ref 6.5–8.1)

## 2023-03-11 LAB — CBC WITH DIFFERENTIAL (CANCER CENTER ONLY)
Abs Immature Granulocytes: 0.02 10*3/uL (ref 0.00–0.07)
Basophils Absolute: 0.1 10*3/uL (ref 0.0–0.1)
Basophils Relative: 2 %
Eosinophils Absolute: 0.3 10*3/uL (ref 0.0–0.5)
Eosinophils Relative: 4 %
HCT: 38.5 % (ref 36.0–46.0)
Hemoglobin: 12.7 g/dL (ref 12.0–15.0)
Immature Granulocytes: 0 %
Lymphocytes Relative: 24 %
Lymphs Abs: 1.7 10*3/uL (ref 0.7–4.0)
MCH: 30.4 pg (ref 26.0–34.0)
MCHC: 33 g/dL (ref 30.0–36.0)
MCV: 92.1 fL (ref 80.0–100.0)
Monocytes Absolute: 0.7 10*3/uL (ref 0.1–1.0)
Monocytes Relative: 10 %
Neutro Abs: 4.5 10*3/uL (ref 1.7–7.7)
Neutrophils Relative %: 60 %
Platelet Count: 252 10*3/uL (ref 150–400)
RBC: 4.18 MIL/uL (ref 3.87–5.11)
RDW: 13.1 % (ref 11.5–15.5)
WBC Count: 7.3 10*3/uL (ref 4.0–10.5)
nRBC: 0 % (ref 0.0–0.2)
nRBC: 0 /100{WBCs}

## 2023-03-11 MED ORDER — DENOSUMAB 60 MG/ML ~~LOC~~ SOSY
60.0000 mg | PREFILLED_SYRINGE | Freq: Once | SUBCUTANEOUS | Status: AC
Start: 2023-03-11 — End: 2023-03-11
  Administered 2023-03-11: 60 mg via SUBCUTANEOUS
  Filled 2023-03-11: qty 1

## 2023-03-11 NOTE — Patient Instructions (Signed)
 Denosumab Injection (Osteoporosis) What is this medication? DENOSUMAB (den oh SUE mab) prevents and treats osteoporosis. It works by Interior and spatial designer stronger and less likely to break (fracture). It is a monoclonal antibody. This medicine may be used for other purposes; ask your health care provider or pharmacist if you have questions. COMMON BRAND NAME(S): Prolia What should I tell my care team before I take this medication? They need to know if you have any of these conditions: Dental or gum disease Had thyroid or parathyroid (glands located in neck) surgery Having dental surgery or a tooth pulled Kidney disease Low levels of calcium in the blood On dialysis Poor nutrition Thyroid disease Trouble absorbing nutrients from your food An unusual or allergic reaction to denosumab, other medications, foods, dyes, or preservatives Pregnant or trying to get pregnant Breastfeeding How should I use this medication? This medication is injected under the skin. It is given by your care team in a hospital or clinic setting. A special MedGuide will be given to you before each treatment. Be sure to read this information carefully each time. Talk to your care team about the use of this medication in children. Special care may be needed. Overdosage: If you think you have taken too much of this medicine contact a poison control center or emergency room at once. NOTE: This medicine is only for you. Do not share this medicine with others. What if I miss a dose? Keep appointments for follow-up doses. It is important not to miss your dose. Call your care team if you are unable to keep an appointment. What may interact with this medication? Do not take this medication with any of the following: Other medications that contain denosumab This medication may also interact with the following: Medications that lower your chance of fighting infection Steroid medications, such as prednisone or cortisone This  list may not describe all possible interactions. Give your health care provider a list of all the medicines, herbs, non-prescription drugs, or dietary supplements you use. Also tell them if you smoke, drink alcohol, or use illegal drugs. Some items may interact with your medicine. What should I watch for while using this medication? Your condition will be monitored carefully while you are receiving this medication. You may need blood work done while taking this medication. This medication may increase your risk of getting an infection. Call your care team for advice if you get a fever, chills, sore throat, or other symptoms of a cold or flu. Do not treat yourself. Try to avoid being around people who are sick. Tell your dentist and dental surgeon that you are taking this medication. You should not have major dental surgery while on this medication. See your dentist to have a dental exam and fix any dental problems before starting this medication. Take good care of your teeth while on this medication. Make sure you see your dentist for regular follow-up appointments. This medication may cause low levels of calcium in your body. The risk of severe side effects is increased in people with kidney disease. Your care team may prescribe calcium and vitamin D to help prevent low calcium levels while you take this medication. It is important to take calcium and vitamin D as directed by your care team. Talk to your care team if you may be pregnant. Serious birth defects may occur if you take this medication during pregnancy and for 5 months after the last dose. You will need a negative pregnancy test before starting this medication. Contraception  is recommended while taking this medication and for 5 months after the last dose. Your care team can help you find the option that works for you. Talk to your care team before breastfeeding. Changes to your treatment plan may be needed. What side effects may I notice from  receiving this medication? Side effects that you should report to your care team as soon as possible: Allergic reactions--skin rash, itching, hives, swelling of the face, lips, tongue, or throat Infection--fever, chills, cough, sore throat, wounds that don't heal, pain or trouble when passing urine, general feeling of discomfort or being unwell Low calcium level--muscle pain or cramps, confusion, tingling, or numbness in the hands or feet Osteonecrosis of the jaw--pain, swelling, or redness in the mouth, numbness of the jaw, poor healing after dental work, unusual discharge from the mouth, visible bones in the mouth Severe bone, joint, or muscle pain Skin infection--skin redness, swelling, warmth, or pain Side effects that usually do not require medical attention (report these to your care team if they continue or are bothersome): Back pain Headache Joint pain Muscle pain Pain in the hands, arms, legs, or feet Runny or stuffy nose Sore throat This list may not describe all possible side effects. Call your doctor for medical advice about side effects. You may report side effects to FDA at 1-800-FDA-1088. Where should I keep my medication? This medication is given in a hospital or clinic. It will not be stored at home. NOTE: This sheet is a summary. It may not cover all possible information. If you have questions about this medicine, talk to your doctor, pharmacist, or health care provider.  2024 Elsevier/Gold Standard (2022-03-23 00:00:00)

## 2023-03-11 NOTE — Progress Notes (Signed)
 [facility]  [address] [place],  [place]  [address] [phone]  Clinic Day:03/11/2023   Referring physician: Street, Lonni HERO, *   CHIEF COMPLAINT:  CC: A 78 year old woman with stage IIA (T2 N56mic M0) hormone positive breast cancer and osteoporosis  Current Treatment:  Anastrazole and Prolia    HISTORY OF PRESENT ILLNESS:  Sheena Joyce is a 78 y.o. female with a history of stage IIA (T2 N18mic M0) hormone positive breast cancer diagnosed in July 2019.  Her history dates back to May 2019 when a breast cancer was discovered on a screening mammogram, which revealed a possible mass in the right breast.  Diagnostic right mammogram revealed a mass in the superior central right breast, which measure 1.3 cm on ultrasound.  There was also a second similar mass measuring 1.1 cm, as well as borderline cortical thickening of a single right axilla.  Biopsies of both lesions were done.  The first revealed a grade 1, invasive lobular carcinoma along with lobular carcinoma in situ and the other biopsy site revealed a 5 mm focus of proliferative fibrocystic changes.  Estrogen and progesterone receptors were highly positive and HER 2 negative.  Ki 67 was 1%.  MRI  breast confirmed multifocal disease with adjacent enhancing masses in the upper inner quadrant of the right breast.  No other malignancy was found in either breast and no pathologic lymphadenopathy was seen.  She was treated with right breast lumpectomy in August 2019.  Pathology revealed a 4.5 cm, grade 2, invasive lobular carcinoma with lobular carcinoma in situ.  Two lymph nodes had micrometastases and 2 lymph nodes were negative for metastasis.  Margins were clear.  No lymphovascular or perineural invasion was seen.  EndoPredict testing revealed a high risk recurrence score.  Due to her personal and family history of malignancy, she underwent testing for hereditary cancer syndromes with the My[employer] myRisk  hereditary cancer panel test.  This did not reveal any clinically significant mutation or variant of uncertain significance.  She was recommended for adjuvant chemotherapy with docetaxel /cyclophosphamide  due to her high risk EndoPredict score.  She had difficulty tolerating treatment due to severe bone pain and headaches.  She then developed a subsegmental right lower lobe pulmonary embolism in October 2019.  She was initially treated with enoxaparin  1 milligram/kilogram twice daily for 1 month, then 1.5 milligrams/kilogram daily.  She was switched to rivaroxaban in December.  She completed 4 cycles of docetaxel /cyclophosphamide  in December 2019. Due to toxicities, the chemotherapy doses were decreased by 20% with her 2nd cycle.  She required IV fluid support and pain medication throughout chemotherapy.   Bone density scan in February 2020 revealed osteoporosis, with a T-score of -2.2 of the spine for osteopenia and -2.6 for the right femur.  We really do not have a comparison study since 2012 and she had osteoporosis at that time, so this not a new problem, but has gone untreated.  She did not tolerate alendronate previously.  She is on calcium with vitamin-D 1500 mg daily.  She was started on anastrozole  1 mg daily at the end of January 2020.  Since completion of chemotherapy, the patient has complained of cough.  We recommended treatment of the osteoporosis with Prolia  and she started that in May 2020 and had an increase in bone pain.  She continued to complain of bone pain in August.  She was seen in November 2020 prior to 2nd dose of Prolia , at which time she reported  back pain.  MRI through her orthopedic office apparently revealed degenerative disc changes.  She also reported chronic nonproductive cough.  We recommended she continue pantoprazole 40 mg daily, as we felt acid reflux could be contributing to her cough.Annual bilateral mammogram in August of 2022 was clear.  She had COVID-19 in November 2022.   A bone density scan in June 2022 still shows osteoporosis of the femur with a T score stable at -2.6.  The spine shows improvement from a T score of -2.2 down to -1.4.  INTERVAL HISTORY:  Sheena Joyce is here for 6 month evaluation for stage IIA (T2 N43mic M0) hormone positive breast cancer. She continues her anastrazole since January of 2020 without significant difficulty. She remains on anastrozole  and is tolerating this well overall and reports some mild hot flashes but no significant arthralgias.  She reports that she is eating well.  She is trying to stay active and get out of the house.    At her last visit, she was reporting abdominal bloating but this has overall resolved.  She has been seen by gynecology since her last visit with us  and underwent a routine woman's exam.  Per the patient report, there were no abnormalities.  She also had a CT of the abdomen/pelvis performed on 09/15/2022 which showed no acute abnormality, no evidence of abdominal or pelvic metastatic disease, colonic diverticulosis without evidence of diverticulitis, 4.1 cm distal abdominal aortic aneurysm.  She is not reporting any fevers, chills, headaches, dizziness, chest pain, shortness of breath.  No nausea, vomiting, constipation, diarrhea reported.  No bleeding reported.  Last bone density was completed in January 2024 which revealed stable osteoporosis and osteopenia in her arms.  She continues to receive Prolia  every 6 months and this is due today. No jaw pain reported.  She remains on calcium and vitamin D.  Her last mammogram was completed on 12/09/2022 which showed no findings suspicious for malignancy.  She denies any new lumps or bumps in her breast.  No skin changes noted.  She also notes that she has been started on medication for urinary incontinence.  She does not believe this medication today.   REVIEW OF SYSTEMS:  Review of Systems  Constitutional:  Positive for fatigue. Negative for appetite change, chills,  diaphoresis, fever and unexpected weight change.  HENT:   Negative for hearing loss, lump/mass, mouth sores, nosebleeds, sore throat, tinnitus, trouble swallowing and voice change.        Alopecia  Eyes: Negative.  Negative for eye problems and icterus.  Respiratory: Negative.  Negative for chest tightness, cough, hemoptysis, shortness of breath and wheezing.   Cardiovascular: Negative.  Negative for chest pain, leg swelling and palpitations.  Gastrointestinal:  Negative for abdominal distention, abdominal pain, blood in stool, constipation, diarrhea, nausea, rectal pain and vomiting.  Endocrine: Positive for hot flashes.  Genitourinary: Negative.  Negative for bladder incontinence, difficulty urinating, dyspareunia, dysuria, frequency, hematuria, menstrual problem, nocturia, pelvic pain, vaginal bleeding and vaginal discharge.   Musculoskeletal: Negative.  Negative for arthralgias, back pain, flank pain, gait problem, myalgias, neck pain and neck stiffness.  Skin: Negative.  Negative for itching, rash and wound.  Neurological: Negative.  Negative for dizziness, extremity weakness, gait problem, headaches, light-headedness, numbness, seizures and speech difficulty.  Hematological: Negative.  Negative for adenopathy. Does not bruise/bleed easily.  Psychiatric/Behavioral:  Negative for confusion, decreased concentration, depression, sleep disturbance and suicidal ideas. The patient is not nervous/anxious.   All other systems reviewed and are negative.  VITALS:  Blood pressure 114/73, pulse 81, temperature 97.6 F (36.4 C), temperature source Oral, resp. rate 18, height 4' 11 (1.499 m), weight 73.8 kg, SpO2 97%.  Wt Readings from Last 3 Encounters:  03/11/23 73.8 kg  09/15/22 76.3 kg  09/08/22 75.4 kg    There is no height or weight on file to calculate BMI.  Performance status (ECOG): 1 - Symptomatic but completely ambulatory  PHYSICAL EXAM:  Physical Exam Vitals and nursing note  reviewed.  Constitutional:      General: She is not in acute distress.    Appearance: Normal appearance. She is normal weight. She is not ill-appearing, toxic-appearing or diaphoretic.  HENT:     Head: Normocephalic and atraumatic.     Right Ear: Tympanic membrane, ear canal and external ear normal. There is no impacted cerumen.     Left Ear: Tympanic membrane, ear canal and external ear normal. There is no impacted cerumen.     Nose: Nose normal. No congestion or rhinorrhea.     Mouth/Throat:     Mouth: Mucous membranes are moist.     Pharynx: Oropharynx is clear. No oropharyngeal exudate or posterior oropharyngeal erythema.  Eyes:     General: No scleral icterus.       Right eye: No discharge.        Left eye: No discharge.     Extraocular Movements: Extraocular movements intact.     Conjunctiva/sclera: Conjunctivae normal.     Pupils: Pupils are equal, round, and reactive to light.  Neck:     Vascular: No carotid bruit.  Cardiovascular:     Rate and Rhythm: Normal rate and regular rhythm.     Pulses: Normal pulses.     Heart sounds: Normal heart sounds. No murmur heard.    No friction rub. No gallop.  Pulmonary:     Effort: Pulmonary effort is normal. No respiratory distress.     Breath sounds: Normal breath sounds. No stridor. No wheezing, rhonchi or rales.  Chest:     Chest wall: No mass, lacerations, deformity, swelling, tenderness, crepitus or edema. There is no dullness to percussion.  Breasts:    Breasts are symmetrical.     Right: Normal. No swelling, bleeding, inverted nipple, mass, nipple discharge, skin change or tenderness.     Left: Normal. No swelling, bleeding, inverted nipple, mass, nipple discharge, skin change or tenderness.     Comments: Well healed scar in the upper outer quadrant of right breast.   No masses and the left breast had moderate fibrocystic changes with severe tenderness.  Abdominal:     General: Abdomen is protuberant. Bowel sounds are normal.  There is no distension.     Palpations: Abdomen is soft. There is no mass.     Tenderness: There is no abdominal tenderness. There is no right CVA tenderness, left CVA tenderness, guarding or rebound.     Hernia: No hernia is present.     Comments: Mildly distended, soft and non tender  Musculoskeletal:        General: No swelling, tenderness, deformity or signs of injury. Normal range of motion.     Cervical back: Normal range of motion and neck supple. No rigidity or tenderness.     Right lower leg: No edema.     Left lower leg: No edema.  Lymphadenopathy:     Cervical: No cervical adenopathy.     Upper Body:     Right upper body: No supraclavicular, axillary or pectoral adenopathy.  Left upper body: No supraclavicular, axillary or pectoral adenopathy.  Skin:    General: Skin is warm and dry.     Capillary Refill: Capillary refill takes less than 2 seconds.     Coloration: Skin is not jaundiced or pale.     Findings: No bruising, erythema, lesion or rash.  Neurological:     General: No focal deficit present.     Mental Status: She is alert and oriented to person, place, and time. Mental status is at baseline.     Cranial Nerves: No cranial nerve deficit.     Sensory: No sensory deficit.     Motor: No weakness.     Coordination: Coordination normal.     Gait: Gait normal.     Deep Tendon Reflexes: Reflexes normal.  Psychiatric:        Mood and Affect: Mood normal.        Behavior: Behavior normal.        Thought Content: Thought content normal.        Judgment: Judgment normal.     LABS:      Latest Ref Rng & Units 03/11/2023    9:47 AM 09/08/2022   11:19 AM 03/05/2022   10:44 AM  CBC  WBC 4.0 - 10.5 K/uL 7.3  8.1  5.9   Hemoglobin 12.0 - 15.0 g/dL 87.2  86.6  87.2   Hematocrit 36.0 - 46.0 % 38.5  42.4  40.5   Platelets 150 - 400 K/uL 252  277  268       Latest Ref Rng & Units 09/08/2022   11:19 AM 03/05/2022   10:44 AM 09/02/2021   12:00 AM  CMP  Glucose 70 - 99  mg/dL 894  815    BUN 8 - 23 mg/dL 14  16  18       Creatinine 0.44 - 1.00 mg/dL 8.98  9.07  0.8      Sodium 135 - 145 mmol/L 138  137  136      Potassium 3.5 - 5.1 mmol/L 3.9  4.2  4.2      Chloride 98 - 111 mmol/L 100  104  104      CO2 22 - 32 mmol/L 26  23  24       Calcium 8.9 - 10.3 mg/dL 9.5  9.1  9.5      Total Protein 6.5 - 8.1 g/dL 7.0  6.7    Total Bilirubin 0.3 - 1.2 mg/dL 0.5  0.5    Alkaline Phos 38 - 126 U/L 55  49  65      AST 15 - 41 U/L 21  22  22       ALT 0 - 44 U/L 15  17  17          This result is from an external source.   No results found for: CEA1, CEA / No results found for: CEA1, CEA No results found for: PSA1 No results found for: CAN199 No results found for: CAN125  No results found for: TOTALPROTELP, ALBUMINELP, A1GS, A2GS, BETS, BETA2SER, GAMS, MSPIKE, SPEI No results found for: TIBC, FERRITIN, IRONPCTSAT No results found for: LDH  STUDIES:           HISTORY:   Past Medical History:  Diagnosis Date   Anxiety    Arthritis    Cancer of overlapping sites of right female breast (HCC) 10/19/2017   Complication of anesthesia    became unresposive after surgery- 2017 , aspirated after  surgery and had pneumonia- 2017    Depression    Diabetes mellitus without complication (HCC)    on the borderline   Diverticulitis    Family history of adverse reaction to anesthesia    mother slow to wake up afgter 5 days, sister had port placed and had issues - went into coma but was very sick    GERD (gastroesophageal reflux disease)    Headache    Pneumonia    PONV (postoperative nausea and vomiting)    Prolapsed uterus    and bladder    Past Surgical History:  Procedure Laterality Date   BREAST LUMPECTOMY WITH RADIOACTIVE SEED AND SENTINEL LYMPH NODE BIOPSY Right 10/19/2017   Procedure: INJECT BLUE DYE RIGHT BREAST AND RIGHT BREAST LUMPECTOMY WITH TWO RADIOACTIVE SEEDS AND RIGHT AXILLARY SENTINEL LYMPH NODE  BIOPSY;  Surgeon: Gail Favorite, MD;  Location: [facility] OR;  Service: General;  Laterality: Right;   COLON SURGERY     HERNIA REPAIR     PORTACATH PLACEMENT Left 12/07/2017   Procedure: INSERTION PORT-A-CATH WITH ULTRA SOUND;  Surgeon: Gail Favorite, MD;  Location: [facility] ORS;  Service: General;  Laterality: Left;    Family History  Problem Relation Age of Onset   Osteoporosis Mother    Heart disease Father     Social History:  reports that she has never smoked. She has never used smokeless tobacco. She reports that she does not drink alcohol and does not use drugs.The patient is alone  today.  Allergies:  Allergies  Allergen Reactions   Other     Other reaction(s): Other (See Comments) Pt states medication after sx made her unresponsive, sounds like oxycodone  but can't remember the name   Codeine Rash   Hydrocodone  Rash    Current Medications: Current Outpatient Medications  Medication Sig Dispense Refill   anastrozole  (ARIMIDEX ) 1 MG tablet Take 1 tablet (1 mg total) by mouth daily. 90 tablet 3   Calcium-Vitamin D-Vitamin K 650-12.5-40 MG-MCG-MCG CHEW Chew 1,300 mg by mouth daily.     Multiple Vitamin (MULTI-VITAMIN) tablet Take 2 tablets by mouth daily.     oxybutynin (DITROPAN-XL) 5 MG 24 hr tablet Take 5 mg by mouth daily.     pantoprazole (PROTONIX) 40 MG tablet Take 1 tablet by mouth daily.     rizatriptan (MAXALT) 10 MG tablet Take 5-10 mg by mouth 2 (two) times daily as needed for migraine. May repeat in 2 hours if needed     simvastatin (ZOCOR) 20 MG tablet Take 20 mg by mouth at bedtime.     traZODone  (DESYREL ) 50 MG tablet TAKE ONE TABLET BY MOUTH ONCE DAILY AT BEDTIME 90 tablet 5   venlafaxine XR (EFFEXOR-XR) 75 MG 24 hr capsule Take 75 mg by mouth daily with breakfast.     No current facility-administered medications for this visit.     ASSESSMENT & PLAN:   Assessment:  Sheena Joyce is a 78 y.o. female with:  1. Stage IIA breast cancer.  She remains without  evidence of recurrence, now 5 years out.  She will continue anastrozole  as long as we can treat her osteoporosis. She has already completed 5 years.  However I pulled out her original Endopredict report from September of 2019.  This was high risk with an EpClin score of 3.8 for a 15% risk of distant recurrence in the next 10 years.  Her absolute chemotherapy benefit was 6%.  Her likelihood of a late distant recurrence in years 5-15 was 12%,  which I consider fairly high.  We will therefore continue consider treating her with extended adjuvant hormonal therapy for 7 to 10 years total if we can control her osteoporosis and she has no other toxicities.  2. History of subsegmental pulmonary embolism in October 2019.  She treated with enoxaparin , then rivaroxaban.  Anticoagulation was discontinued in February 2020. She does not have symptoms suggestive of recurrence.  3. Osteoporosis, for which she is receiving Prolia  injections every 6 months as of May 2020.  She had significant bone pain associated with this, but started Claritin and did not have as much bone pain with her next injection.    4. History of COVID 19 in November of 2022.    Plan: She has no evidence of recurrent breast cancer and is tolerating her anastrozole  well.  We will continue on this medication.  Mammogram is up-to-date and she will need a repeat mammogram in October 2025.  She remains on Prolia  and is tolerating this well.  Labs from today have been reviewed and she will proceed with her Prolia  injection as scheduled.  Continue calcium and vitamin D.  She will return for lab and follow-up visit in 6 months.  Prolia  injection will be due again with her next visit in 6 months.  She knows to call the office should any new questions or concerns arise.   Shakendra Griffeth, NP

## 2023-03-14 ENCOUNTER — Ambulatory Visit: Payer: HMO

## 2023-05-17 DIAGNOSIS — L814 Other melanin hyperpigmentation: Secondary | ICD-10-CM | POA: Diagnosis not present

## 2023-05-17 DIAGNOSIS — M71341 Other bursal cyst, right hand: Secondary | ICD-10-CM | POA: Diagnosis not present

## 2023-05-17 DIAGNOSIS — L820 Inflamed seborrheic keratosis: Secondary | ICD-10-CM | POA: Diagnosis not present

## 2023-05-17 DIAGNOSIS — L281 Prurigo nodularis: Secondary | ICD-10-CM | POA: Diagnosis not present

## 2023-05-17 DIAGNOSIS — L578 Other skin changes due to chronic exposure to nonionizing radiation: Secondary | ICD-10-CM | POA: Diagnosis not present

## 2023-07-26 ENCOUNTER — Other Ambulatory Visit: Payer: Self-pay | Admitting: Hematology and Oncology

## 2023-07-26 MED ORDER — ANASTROZOLE 1 MG PO TABS: 1.0000 mg | ORAL_TABLET | Freq: Every day | ORAL | 3 refills | Status: AC

## 2023-09-07 ENCOUNTER — Other Ambulatory Visit: Payer: Self-pay | Admitting: Hematology and Oncology

## 2023-09-07 DIAGNOSIS — Z170 Estrogen receptor positive status [ER+]: Secondary | ICD-10-CM

## 2023-09-08 ENCOUNTER — Inpatient Hospital Stay: Payer: HMO | Admitting: Hematology and Oncology

## 2023-09-08 ENCOUNTER — Inpatient Hospital Stay: Payer: HMO

## 2023-09-14 ENCOUNTER — Inpatient Hospital Stay: Attending: Hematology and Oncology

## 2023-09-14 ENCOUNTER — Telehealth: Payer: Self-pay | Admitting: Hematology and Oncology

## 2023-09-14 ENCOUNTER — Inpatient Hospital Stay

## 2023-09-14 ENCOUNTER — Other Ambulatory Visit: Payer: Self-pay

## 2023-09-14 ENCOUNTER — Inpatient Hospital Stay (HOSPITAL_BASED_OUTPATIENT_CLINIC_OR_DEPARTMENT_OTHER): Admitting: Hematology and Oncology

## 2023-09-14 VITALS — BP 118/83 | HR 75 | Temp 98.3°F | Resp 16 | Ht 59.0 in | Wt 167.7 lb

## 2023-09-14 DIAGNOSIS — Z170 Estrogen receptor positive status [ER+]: Secondary | ICD-10-CM

## 2023-09-14 DIAGNOSIS — M810 Age-related osteoporosis without current pathological fracture: Secondary | ICD-10-CM | POA: Diagnosis not present

## 2023-09-14 DIAGNOSIS — C50811 Malignant neoplasm of overlapping sites of right female breast: Secondary | ICD-10-CM | POA: Diagnosis not present

## 2023-09-14 LAB — CBC WITH DIFFERENTIAL (CANCER CENTER ONLY)
Abs Immature Granulocytes: 0.02 K/uL (ref 0.00–0.07)
Basophils Absolute: 0.1 K/uL (ref 0.0–0.1)
Basophils Relative: 1 %
Eosinophils Absolute: 0.1 K/uL (ref 0.0–0.5)
Eosinophils Relative: 2 %
HCT: 42.3 % (ref 36.0–46.0)
Hemoglobin: 13.4 g/dL (ref 12.0–15.0)
Immature Granulocytes: 0 %
Lymphocytes Relative: 27 %
Lymphs Abs: 2.2 K/uL (ref 0.7–4.0)
MCH: 29.6 pg (ref 26.0–34.0)
MCHC: 31.7 g/dL (ref 30.0–36.0)
MCV: 93.4 fL (ref 80.0–100.0)
Monocytes Absolute: 0.7 K/uL (ref 0.1–1.0)
Monocytes Relative: 9 %
Neutro Abs: 4.9 K/uL (ref 1.7–7.7)
Neutrophils Relative %: 61 %
Platelet Count: 283 K/uL (ref 150–400)
RBC: 4.53 MIL/uL (ref 3.87–5.11)
RDW: 13.2 % (ref 11.5–15.5)
WBC Count: 8 K/uL (ref 4.0–10.5)
nRBC: 0 % (ref 0.0–0.2)

## 2023-09-14 LAB — CMP (CANCER CENTER ONLY)
ALT: 11 U/L (ref 0–44)
AST: 23 U/L (ref 15–41)
Albumin: 4.1 g/dL (ref 3.5–5.0)
Alkaline Phosphatase: 72 U/L (ref 38–126)
Anion gap: 11 (ref 5–15)
BUN: 14 mg/dL (ref 8–23)
CO2: 25 mmol/L (ref 22–32)
Calcium: 9.4 mg/dL (ref 8.9–10.3)
Chloride: 101 mmol/L (ref 98–111)
Creatinine: 1.2 mg/dL — ABNORMAL HIGH (ref 0.44–1.00)
GFR, Estimated: 46 mL/min — ABNORMAL LOW (ref >60)
Glucose, Bld: 102 mg/dL — ABNORMAL HIGH (ref 70–99)
Potassium: 4.8 mmol/L (ref 3.5–5.1)
Sodium: 138 mmol/L (ref 135–145)
Total Bilirubin: 0.4 mg/dL (ref 0.0–1.2)
Total Protein: 7.1 g/dL (ref 6.5–8.1)

## 2023-09-14 MED ORDER — DENOSUMAB 60 MG/ML ~~LOC~~ SOSY
60.0000 mg | PREFILLED_SYRINGE | Freq: Once | SUBCUTANEOUS | Status: AC
  Administered 2023-09-14: 60 mg via SUBCUTANEOUS
  Filled 2023-09-14: qty 1

## 2023-09-14 MED ORDER — ONDANSETRON HCL 8 MG PO TABS: 8.0000 mg | ORAL_TABLET | Freq: Three times a day (TID) | ORAL | 0 refills | Status: AC | PRN

## 2023-09-14 NOTE — Telephone Encounter (Signed)
 Patient has been scheduled for follow-up visit per 09/14/23 LOS.  Pt given an appt calendar with date and time.

## 2023-09-14 NOTE — Progress Notes (Signed)
 [facility]  [address] [place],  [place]  [address] [phone]  Clinic Day:03/11/2023   Referring physician: Street, Lonni HERO, *   CHIEF COMPLAINT:  CC: A 78 year old woman with stage IIA (T2 N83mic M0) hormone positive breast cancer and osteoporosis  Current Treatment:  Anastrazole and Prolia    HISTORY OF PRESENT ILLNESS:  Sheena Joyce is a 78 y.o. female with a history of stage IIA (T2 N35mic M0) hormone positive breast cancer diagnosed in July 2019.  Her history dates back to May 2019 when a breast cancer was discovered on a screening mammogram, which revealed a possible mass in the right breast.  Diagnostic right mammogram revealed a mass in the superior central right breast, which measure 1.3 cm on ultrasound.  There was also a second similar mass measuring 1.1 cm, as well as borderline cortical thickening of a single right axilla.  Biopsies of both lesions were done.  The first revealed a grade 1, invasive lobular carcinoma along with lobular carcinoma in situ and the other biopsy site revealed a 5 mm focus of proliferative fibrocystic changes.  Estrogen and progesterone receptors were highly positive and HER 2 negative.  Ki 67 was 1%.  MRI  breast confirmed multifocal disease with adjacent enhancing masses in the upper inner quadrant of the right breast.  No other malignancy was found in either breast and no pathologic lymphadenopathy was seen.  She was treated with right breast lumpectomy in August 2019.  Pathology revealed a 4.5 cm, grade 2, invasive lobular carcinoma with lobular carcinoma in situ.  Two lymph nodes had micrometastases and 2 lymph nodes were negative for metastasis.  Margins were clear.  No lymphovascular or perineural invasion was seen.  EndoPredict testing revealed a high risk recurrence score.  Due to her personal and family history of malignancy, she underwent testing for hereditary cancer syndromes with the [employer] myRisk  hereditary cancer panel test.  This did not reveal any clinically significant mutation or variant of uncertain significance.  She was recommended for adjuvant chemotherapy with docetaxel /cyclophosphamide  due to her high risk EndoPredict score.  She had difficulty tolerating treatment due to severe bone pain and headaches.  She then developed a subsegmental right lower lobe pulmonary embolism in October 2019.  She was initially treated with enoxaparin  1 milligram/kilogram twice daily for 1 month, then 1.5 milligrams/kilogram daily.  She was switched to rivaroxaban in December.  She completed 4 cycles of docetaxel /cyclophosphamide  in December 2019. Due to toxicities, the chemotherapy doses were decreased by 20% with her 2nd cycle.  She required IV fluid support and pain medication throughout chemotherapy.   Bone density scan in February 2020 revealed osteoporosis, with a T-score of -2.2 of the spine for osteopenia and -2.6 for the right femur.  We really do not have a comparison study since 2012 and she had osteoporosis at that time, so this not a new problem, but has gone untreated.  She did not tolerate alendronate previously.  She is on calcium with vitamin-D 1500 mg daily.  She was started on anastrozole  1 mg daily at the end of January 2020.  Since completion of chemotherapy, the patient has complained of cough.  We recommended treatment of the osteoporosis with Prolia  and she started that in May 2020 and had an increase in bone pain.  She continued to complain of bone pain in August.  She was seen in November 2020 prior to 2nd dose of Prolia , at which time she reported  back pain.  MRI through her orthopedic office apparently revealed degenerative disc changes.  She also reported chronic nonproductive cough.  We recommended she continue pantoprazole 40 mg daily, as we felt acid reflux could be contributing to her cough.Annual bilateral mammogram in August of 2022 was clear.  She had COVID-19 in November 2022.   A bone density scan in June 2022 still shows osteoporosis of the femur with a T score stable at -2.6.  The spine shows improvement from a T score of -2.2 down to -1.4.  INTERVAL HISTORY:  Sheena Joyce is here for 6 month evaluation for stage IIA (T2 N1mic M0) hormone positive breast cancer. She continues her anastrazole since January of 2020 without significant difficulty. She remains on anastrozole  and is tolerating this well overall and reports some mild hot flashes but no significant arthralgias.  She reports that she is eating well.  She is trying to stay active and get out of the house.    At her last visit, she was reporting abdominal bloating but this has overall resolved.  She has been seen by gynecology since her last visit with us  and underwent a routine woman's exam.  Per the patient report, there were no abnormalities.  She also had a CT of the abdomen/pelvis performed on 09/15/2022 which showed no acute abnormality, no evidence of abdominal or pelvic metastatic disease, colonic diverticulosis without evidence of diverticulitis, 4.1 cm distal abdominal aortic aneurysm.  She is not reporting any fevers, chills, headaches, dizziness, chest pain, shortness of breath.  No nausea, vomiting, constipation, diarrhea reported.  No bleeding reported.  Last bone density was completed in January 2024 which revealed stable osteoporosis and osteopenia in her arms.  She continues to receive Prolia  every 6 months and this is due today. No jaw pain reported.  She remains on calcium and vitamin D.  Her last mammogram was completed on 12/09/2022 which showed no findings suspicious for malignancy.  She denies any new lumps or bumps in her breast.  No skin changes noted.  She also notes that she has been started on medication for urinary incontinence.  She does not believe this medication today.   REVIEW OF SYSTEMS:  Review of Systems  Constitutional:  Positive for fatigue. Negative for appetite change, chills,  diaphoresis, fever and unexpected weight change.  HENT:   Negative for hearing loss, lump/mass, mouth sores, nosebleeds, sore throat, tinnitus, trouble swallowing and voice change.        Alopecia  Eyes: Negative.  Negative for eye problems and icterus.  Respiratory: Negative.  Negative for chest tightness, cough, hemoptysis, shortness of breath and wheezing.   Cardiovascular: Negative.  Negative for chest pain, leg swelling and palpitations.  Gastrointestinal:  Negative for abdominal distention, abdominal pain, blood in stool, constipation, diarrhea, nausea, rectal pain and vomiting.  Endocrine: Positive for hot flashes.  Genitourinary: Negative.  Negative for bladder incontinence, difficulty urinating, dyspareunia, dysuria, frequency, hematuria, menstrual problem, nocturia, pelvic pain, vaginal bleeding and vaginal discharge.   Musculoskeletal: Negative.  Negative for arthralgias, back pain, flank pain, gait problem, myalgias, neck pain and neck stiffness.  Skin: Negative.  Negative for itching, rash and wound.  Neurological: Negative.  Negative for dizziness, extremity weakness, gait problem, headaches, light-headedness, numbness, seizures and speech difficulty.  Hematological: Negative.  Negative for adenopathy. Does not bruise/bleed easily.  Psychiatric/Behavioral:  Negative for confusion, decreased concentration, depression, sleep disturbance and suicidal ideas. The patient is not nervous/anxious.   All other systems reviewed and are negative.  VITALS:  Blood pressure 114/73, pulse 81, temperature 97.6 F (36.4 C), temperature source Oral, resp. rate 18, height 4' 11 (1.499 m), weight 73.8 kg, SpO2 97%.  Wt Readings from Last 3 Encounters:  03/11/23 73.8 kg  09/15/22 76.3 kg  09/08/22 75.4 kg    There is no height or weight on file to calculate BMI.  Performance status (ECOG): 1 - Symptomatic but completely ambulatory  PHYSICAL EXAM:  Physical Exam Vitals and nursing note  reviewed.  Constitutional:      General: She is not in acute distress.    Appearance: Normal appearance. She is normal weight. She is not ill-appearing, toxic-appearing or diaphoretic.  HENT:     Head: Normocephalic and atraumatic.     Right Ear: Tympanic membrane, ear canal and external ear normal. There is no impacted cerumen.     Left Ear: Tympanic membrane, ear canal and external ear normal. There is no impacted cerumen.     Nose: Nose normal. No congestion or rhinorrhea.     Mouth/Throat:     Mouth: Mucous membranes are moist.     Pharynx: Oropharynx is clear. No oropharyngeal exudate or posterior oropharyngeal erythema.  Eyes:     General: No scleral icterus.       Right eye: No discharge.        Left eye: No discharge.     Extraocular Movements: Extraocular movements intact.     Conjunctiva/sclera: Conjunctivae normal.     Pupils: Pupils are equal, round, and reactive to light.  Neck:     Vascular: No carotid bruit.  Cardiovascular:     Rate and Rhythm: Normal rate and regular rhythm.     Pulses: Normal pulses.     Heart sounds: Normal heart sounds. No murmur heard.    No friction rub. No gallop.  Pulmonary:     Effort: Pulmonary effort is normal. No respiratory distress.     Breath sounds: Normal breath sounds. No stridor. No wheezing, rhonchi or rales.  Chest:     Chest wall: No mass, lacerations, deformity, swelling, tenderness, crepitus or edema. There is no dullness to percussion.  Breasts:    Breasts are symmetrical.     Right: Normal. No swelling, bleeding, inverted nipple, mass, nipple discharge, skin change or tenderness.     Left: Normal. No swelling, bleeding, inverted nipple, mass, nipple discharge, skin change or tenderness.     Comments: Well healed scar in the upper outer quadrant of right breast.   No masses and the left breast had moderate fibrocystic changes with severe tenderness.  Abdominal:     General: Abdomen is protuberant. Bowel sounds are normal.  There is no distension.     Palpations: Abdomen is soft. There is no mass.     Tenderness: There is no abdominal tenderness. There is no right CVA tenderness, left CVA tenderness, guarding or rebound.     Hernia: No hernia is present.     Comments: Mildly distended, soft and non tender  Musculoskeletal:        General: No swelling, tenderness, deformity or signs of injury. Normal range of motion.     Cervical back: Normal range of motion and neck supple. No rigidity or tenderness.     Right lower leg: No edema.     Left lower leg: No edema.  Lymphadenopathy:     Cervical: No cervical adenopathy.     Upper Body:     Right upper body: No supraclavicular, axillary or pectoral adenopathy.  Left upper body: No supraclavicular, axillary or pectoral adenopathy.  Skin:    General: Skin is warm and dry.     Capillary Refill: Capillary refill takes less than 2 seconds.     Coloration: Skin is not jaundiced or pale.     Findings: No bruising, erythema, lesion or rash.  Neurological:     General: No focal deficit present.     Mental Status: She is alert and oriented to person, place, and time. Mental status is at baseline.     Cranial Nerves: No cranial nerve deficit.     Sensory: No sensory deficit.     Motor: No weakness.     Coordination: Coordination normal.     Gait: Gait normal.     Deep Tendon Reflexes: Reflexes normal.  Psychiatric:        Mood and Affect: Mood normal.        Behavior: Behavior normal.        Thought Content: Thought content normal.        Judgment: Judgment normal.     LABS:      Latest Ref Rng & Units 03/11/2023    9:47 AM 09/08/2022   11:19 AM 03/05/2022   10:44 AM  CBC  WBC 4.0 - 10.5 K/uL 7.3  8.1  5.9   Hemoglobin 12.0 - 15.0 g/dL 87.2  86.6  87.2   Hematocrit 36.0 - 46.0 % 38.5  42.4  40.5   Platelets 150 - 400 K/uL 252  277  268       Latest Ref Rng & Units 09/08/2022   11:19 AM 03/05/2022   10:44 AM 09/02/2021   12:00 AM  CMP  Glucose 70 - 99  mg/dL 894  815    BUN 8 - 23 mg/dL 14  16  18       Creatinine 0.44 - 1.00 mg/dL 8.98  9.07  0.8      Sodium 135 - 145 mmol/L 138  137  136      Potassium 3.5 - 5.1 mmol/L 3.9  4.2  4.2      Chloride 98 - 111 mmol/L 100  104  104      CO2 22 - 32 mmol/L 26  23  24       Calcium 8.9 - 10.3 mg/dL 9.5  9.1  9.5      Total Protein 6.5 - 8.1 g/dL 7.0  6.7    Total Bilirubin 0.3 - 1.2 mg/dL 0.5  0.5    Alkaline Phos 38 - 126 U/L 55  49  65      AST 15 - 41 U/L 21  22  22       ALT 0 - 44 U/L 15  17  17          This result is from an external source.   No results found for: CEA1, CEA / No results found for: CEA1, CEA No results found for: PSA1 No results found for: CAN199 No results found for: CAN125  No results found for: TOTALPROTELP, ALBUMINELP, A1GS, A2GS, BETS, BETA2SER, GAMS, MSPIKE, SPEI No results found for: TIBC, FERRITIN, IRONPCTSAT No results found for: LDH  STUDIES:           HISTORY:   Past Medical History:  Diagnosis Date   Anxiety    Arthritis    Cancer of overlapping sites of right female breast (HCC) 10/19/2017   Complication of anesthesia    became unresposive after surgery- 2017 , aspirated after  surgery and had pneumonia- 2017    Depression    Diabetes mellitus without complication (HCC)    on the borderline   Diverticulitis    Family history of adverse reaction to anesthesia    mother slow to wake up afgter 5 days, sister had port placed and had issues - went into coma but was very sick    GERD (gastroesophageal reflux disease)    Headache    Pneumonia    PONV (postoperative nausea and vomiting)    Prolapsed uterus    and bladder    Past Surgical History:  Procedure Laterality Date   BREAST LUMPECTOMY WITH RADIOACTIVE SEED AND SENTINEL LYMPH NODE BIOPSY Right 10/19/2017   Procedure: INJECT BLUE DYE RIGHT BREAST AND RIGHT BREAST LUMPECTOMY WITH TWO RADIOACTIVE SEEDS AND RIGHT AXILLARY SENTINEL LYMPH NODE  BIOPSY;  Surgeon: Gail Favorite, MD;  Location: [facility] OR;  Service: General;  Laterality: Right;   COLON SURGERY     HERNIA REPAIR     PORTACATH PLACEMENT Left 12/07/2017   Procedure: INSERTION PORT-A-CATH WITH ULTRA SOUND;  Surgeon: Gail Favorite, MD;  Location: [facility] ORS;  Service: General;  Laterality: Left;    Family History  Problem Relation Age of Onset   Osteoporosis Mother    Heart disease Father     Social History:  reports that she has never smoked. She has never used smokeless tobacco. She reports that she does not drink alcohol and does not use drugs.The patient is alone  today.  Allergies:  Allergies  Allergen Reactions   Other     Other reaction(s): Other (See Comments) Pt states medication after sx made her unresponsive, sounds like oxycodone  but can't remember the name   Codeine Rash   Hydrocodone  Rash    Current Medications: Current Outpatient Medications  Medication Sig Dispense Refill   anastrozole  (ARIMIDEX ) 1 MG tablet Take 1 tablet (1 mg total) by mouth daily. 90 tablet 3   Calcium-Vitamin D-Vitamin K 650-12.5-40 MG-MCG-MCG CHEW Chew 1,300 mg by mouth daily.     Multiple Vitamin (MULTI-VITAMIN) tablet Take 2 tablets by mouth daily.     oxybutynin (DITROPAN-XL) 5 MG 24 hr tablet Take 5 mg by mouth daily.     pantoprazole (PROTONIX) 40 MG tablet Take 1 tablet by mouth daily.     rizatriptan (MAXALT) 10 MG tablet Take 5-10 mg by mouth 2 (two) times daily as needed for migraine. May repeat in 2 hours if needed     simvastatin (ZOCOR) 20 MG tablet Take 20 mg by mouth at bedtime.     traZODone  (DESYREL ) 50 MG tablet TAKE ONE TABLET BY MOUTH ONCE DAILY AT BEDTIME 90 tablet 5   venlafaxine XR (EFFEXOR-XR) 75 MG 24 hr capsule Take 75 mg by mouth daily with breakfast.     No current facility-administered medications for this visit.     ASSESSMENT & PLAN:   Assessment:  IYANIA DENNE is a 78 y.o. female with:  1. Stage IIA breast cancer.  She remains without  evidence of recurrence, now 5 years out.  She will continue anastrozole  as long as we can treat her osteoporosis. She has already completed 5 years.  However I pulled out her original Endopredict report from September of 2019.  This was high risk with an EpClin score of 3.8 for a 15% risk of distant recurrence in the next 10 years.  Her absolute chemotherapy benefit was 6%.  Her likelihood of a late distant recurrence in years 5-15 was 12%,  which I consider fairly high.  We will therefore continue consider treating her with extended adjuvant hormonal therapy for 7 to 10 years total if we can control her osteoporosis and she has no other toxicities.  2. History of subsegmental pulmonary embolism in October 2019.  She treated with enoxaparin , then rivaroxaban.  Anticoagulation was discontinued in February 2020. She does not have symptoms suggestive of recurrence.  3. Osteoporosis, for which she is receiving Prolia  injections every 6 months as of May 2020.  She had significant bone pain associated with this, but started Claritin and did not have as much bone pain with her next injection.    4. History of COVID 19 in November of 2022.    Plan: She has no evidence of recurrent breast cancer and is tolerating her anastrozole  well.  We will continue on this medication.  Mammogram is up-to-date and she will need a repeat mammogram in October 2025.  She remains on Prolia  and is tolerating this well.  Labs from today have been reviewed and she will proceed with her Prolia  injection as scheduled.  Continue calcium and vitamin D.  She will return for lab and follow-up visit in 6 months.  Prolia  injection will be due again with her next visit in 6 months.  She knows to call the office should any new questions or concerns arise.   Eleanor Bach, FNP- [facility] [phone]

## 2023-09-30 ENCOUNTER — Other Ambulatory Visit: Payer: Self-pay | Admitting: Oncology

## 2023-09-30 DIAGNOSIS — Z1239 Encounter for other screening for malignant neoplasm of breast: Secondary | ICD-10-CM

## 2023-10-17 ENCOUNTER — Encounter (HOSPITAL_BASED_OUTPATIENT_CLINIC_OR_DEPARTMENT_OTHER): Payer: Self-pay

## 2023-12-08 DIAGNOSIS — M19041 Primary osteoarthritis, right hand: Secondary | ICD-10-CM | POA: Diagnosis not present

## 2023-12-08 DIAGNOSIS — M67441 Ganglion, right hand: Secondary | ICD-10-CM | POA: Diagnosis not present

## 2024-02-02 ENCOUNTER — Telehealth: Payer: Self-pay

## 2024-02-02 ENCOUNTER — Other Ambulatory Visit: Payer: Self-pay

## 2024-02-02 ENCOUNTER — Other Ambulatory Visit: Payer: Self-pay | Admitting: Hematology and Oncology

## 2024-02-02 DIAGNOSIS — F32A Depression, unspecified: Secondary | ICD-10-CM

## 2024-02-02 MED ORDER — TRAZODONE HCL 50 MG PO TABS: 50.0000 mg | ORAL_TABLET | Freq: Every day | ORAL | 3 refills | Status: AC

## 2024-02-02 NOTE — Telephone Encounter (Signed)
 Patient request trazodone  refill to [facility].

## 2024-02-09 DIAGNOSIS — Z0000 Encounter for general adult medical examination without abnormal findings: Secondary | ICD-10-CM | POA: Diagnosis not present

## 2024-02-09 DIAGNOSIS — E785 Hyperlipidemia, unspecified: Secondary | ICD-10-CM | POA: Diagnosis not present

## 2024-02-09 DIAGNOSIS — Z1331 Encounter for screening for depression: Secondary | ICD-10-CM | POA: Diagnosis not present

## 2024-02-09 DIAGNOSIS — E1122 Type 2 diabetes mellitus with diabetic chronic kidney disease: Secondary | ICD-10-CM | POA: Diagnosis not present

## 2024-02-09 DIAGNOSIS — Z1339 Encounter for screening examination for other mental health and behavioral disorders: Secondary | ICD-10-CM | POA: Diagnosis not present

## 2024-02-09 DIAGNOSIS — M4722 Other spondylosis with radiculopathy, cervical region: Secondary | ICD-10-CM | POA: Diagnosis not present

## 2024-02-09 DIAGNOSIS — R404 Transient alteration of awareness: Secondary | ICD-10-CM | POA: Diagnosis not present

## 2024-02-09 DIAGNOSIS — M810 Age-related osteoporosis without current pathological fracture: Secondary | ICD-10-CM | POA: Diagnosis not present

## 2024-02-09 DIAGNOSIS — N1831 Chronic kidney disease, stage 3a: Secondary | ICD-10-CM | POA: Diagnosis not present

## 2024-02-09 DIAGNOSIS — C50911 Malignant neoplasm of unspecified site of right female breast: Secondary | ICD-10-CM | POA: Diagnosis not present

## 2024-02-09 DIAGNOSIS — F331 Major depressive disorder, recurrent, moderate: Secondary | ICD-10-CM | POA: Diagnosis not present

## 2024-02-09 DIAGNOSIS — Z9181 History of falling: Secondary | ICD-10-CM | POA: Diagnosis not present

## 2024-03-09 ENCOUNTER — Encounter: Payer: Self-pay | Admitting: Oncology

## 2024-03-15 ENCOUNTER — Other Ambulatory Visit: Payer: Self-pay | Admitting: Oncology

## 2024-03-15 DIAGNOSIS — C50811 Malignant neoplasm of overlapping sites of right female breast: Secondary | ICD-10-CM

## 2024-03-16 ENCOUNTER — Inpatient Hospital Stay: Payer: Self-pay | Admitting: Oncology

## 2024-03-16 ENCOUNTER — Encounter: Payer: Self-pay | Admitting: Oncology

## 2024-03-16 ENCOUNTER — Inpatient Hospital Stay: Payer: Self-pay

## 2024-03-16 ENCOUNTER — Other Ambulatory Visit: Payer: Self-pay | Admitting: Oncology

## 2024-03-16 ENCOUNTER — Inpatient Hospital Stay: Payer: Self-pay | Attending: Oncology

## 2024-03-16 ENCOUNTER — Ambulatory Visit (HOSPITAL_BASED_OUTPATIENT_CLINIC_OR_DEPARTMENT_OTHER)
Admission: RE | Admit: 2024-03-16 | Discharge: 2024-03-16 | Disposition: A | Discharge status: Home / Self Care | Source: Ambulatory Visit | Attending: Oncology | Admitting: Oncology

## 2024-03-16 ENCOUNTER — Telehealth: Payer: Self-pay | Admitting: Oncology

## 2024-03-16 VITALS — BP 122/70 | HR 80 | Temp 98.2°F | Resp 18 | Ht 59.0 in | Wt 167.7 lb

## 2024-03-16 DIAGNOSIS — Z1239 Encounter for other screening for malignant neoplasm of breast: Secondary | ICD-10-CM

## 2024-03-16 DIAGNOSIS — Z170 Estrogen receptor positive status [ER+]: Secondary | ICD-10-CM | POA: Diagnosis not present

## 2024-03-16 DIAGNOSIS — Z1231 Encounter for screening mammogram for malignant neoplasm of breast: Secondary | ICD-10-CM | POA: Diagnosis not present

## 2024-03-16 DIAGNOSIS — M810 Age-related osteoporosis without current pathological fracture: Secondary | ICD-10-CM

## 2024-03-16 DIAGNOSIS — C50811 Malignant neoplasm of overlapping sites of right female breast: Secondary | ICD-10-CM | POA: Diagnosis not present

## 2024-03-16 DIAGNOSIS — C50211 Malignant neoplasm of upper-inner quadrant of right female breast: Secondary | ICD-10-CM | POA: Insufficient documentation

## 2024-03-16 LAB — CBC WITH DIFFERENTIAL (CANCER CENTER ONLY)
Abs Immature Granulocytes: 0.02 K/uL (ref 0.00–0.07)
Basophils Absolute: 0.1 K/uL (ref 0.0–0.1)
Basophils Relative: 1 %
Eosinophils Absolute: 0.2 K/uL (ref 0.0–0.5)
Eosinophils Relative: 2 %
HCT: 42.1 % (ref 36.0–46.0)
Hemoglobin: 13.3 g/dL (ref 12.0–15.0)
Immature Granulocytes: 0 %
Lymphocytes Relative: 27 %
Lymphs Abs: 2.1 K/uL (ref 0.7–4.0)
MCH: 29.4 pg (ref 26.0–34.0)
MCHC: 31.6 g/dL (ref 30.0–36.0)
MCV: 92.9 fL (ref 80.0–100.0)
Monocytes Absolute: 0.7 K/uL (ref 0.1–1.0)
Monocytes Relative: 9 %
Neutro Abs: 4.8 K/uL (ref 1.7–7.7)
Neutrophils Relative %: 61 %
Platelet Count: 269 K/uL (ref 150–400)
RBC: 4.53 MIL/uL (ref 3.87–5.11)
RDW: 13.2 % (ref 11.5–15.5)
WBC Count: 7.8 K/uL (ref 4.0–10.5)
nRBC: 0 % (ref 0.0–0.2)

## 2024-03-16 LAB — CMP (CANCER CENTER ONLY)
ALT: 17 U/L (ref 0–44)
AST: 25 U/L (ref 15–41)
Albumin: 4.3 g/dL (ref 3.5–5.0)
Alkaline Phosphatase: 65 U/L (ref 38–126)
Anion gap: 10 (ref 5–15)
BUN: 12 mg/dL (ref 8–23)
CO2: 24 mmol/L (ref 22–32)
Calcium: 9.7 mg/dL (ref 8.9–10.3)
Chloride: 105 mmol/L (ref 98–111)
Creatinine: 0.95 mg/dL (ref 0.44–1.00)
GFR, Estimated: >60 mL/min
Glucose, Bld: 109 mg/dL — ABNORMAL HIGH (ref 70–99)
Potassium: 4.3 mmol/L (ref 3.5–5.1)
Sodium: 139 mmol/L (ref 135–145)
Total Bilirubin: 0.3 mg/dL (ref 0.0–1.2)
Total Protein: 6.6 g/dL (ref 6.5–8.1)

## 2024-03-16 MED ORDER — DENOSUMAB 60 MG/ML ~~LOC~~ SOSY
60.0000 mg | PREFILLED_SYRINGE | Freq: Once | SUBCUTANEOUS | Status: AC
  Administered 2024-03-16: 60 mg via SUBCUTANEOUS
  Filled 2024-03-16: qty 1

## 2024-03-16 NOTE — Progress Notes (Signed)
 " [facility]  [address] [place],  [place]  [address] [phone]  Clinic Day: 03/16/2024  Referring physician: Rusty Lonni HERO,   CHIEF COMPLAINT:  CC: A 79 year old woman with stage IIA (T2 N82mic M0) hormone positive breast cancer and osteoporosis  Current Treatment:  Anastrazole and Prolia   HISTORY OF PRESENT ILLNESS:  Sheena Joyce is a 79 y.o. female with a history of stage IIA (T2 N21mic M0) hormone positive breast cancer diagnosed in July 2019.  Her history dates back to May 2019 when a breast cancer was discovered on a screening mammogram, which revealed a possible mass in the right breast.  Diagnostic right mammogram revealed a mass in the superior central right breast, which measure 1.3 cm on ultrasound.  There was also a second similar mass measuring 1.1 cm, as well as borderline cortical thickening of a single right axilla.  Biopsies of both lesions were done.  The first revealed a grade 1, invasive lobular carcinoma along with lobular carcinoma in situ and the other biopsy site revealed a 5 mm focus of proliferative fibrocystic changes.  Estrogen and progesterone receptors were highly positive and HER 2 negative.  Ki 67 was 1%.  MRI  breast confirmed multifocal disease with adjacent enhancing masses in the upper inner quadrant of the right breast.  No other malignancy was found in either breast and no pathologic lymphadenopathy was seen.  She was treated with right breast lumpectomy in August 2019.  Pathology revealed a 4.5 cm, grade 2, invasive lobular carcinoma with lobular carcinoma in situ.  Two lymph nodes had micrometastases and 2 lymph nodes were negative for metastasis.  Margins were clear.  No lymphovascular or perineural invasion was seen.  EndoPredict testing revealed a high risk recurrence score.  Due to her personal and family history of malignancy, she underwent testing for hereditary cancer syndromes with the My[employer] myRisk hereditary cancer panel test.   This did not reveal any clinically significant mutation or variant of uncertain significance.  She was recommended for adjuvant chemotherapy with docetaxel /cyclophosphamide  due to her high risk EndoPredict score.  She had difficulty tolerating treatment due to severe bone pain and headaches.  She then developed a subsegmental right lower lobe pulmonary embolism in October 2019.  She was initially treated with enoxaparin  1 milligram/kilogram twice daily for 1 month, then 1.5 milligrams/kilogram daily.  She was switched to rivaroxaban in December.  She completed 4 cycles of docetaxel /cyclophosphamide  in December 2019. Due to toxicities, the chemotherapy doses were decreased by 20% with her 2nd cycle.  She required IV fluid support and pain medication throughout chemotherapy.   Bone density scan in February 2020 revealed osteoporosis, with a T-score of -2.2 of the spine for osteopenia and -2.6 for the right femur.  We really do not have a comparison study since 2012 and she had osteoporosis at that time, so this not a new problem, but has gone untreated.  She did not tolerate alendronate previously.  She is on calcium with vitamin-D 1500 mg daily.  She was started on anastrozole  1 mg daily at the end of January 2020.  Since completion of chemotherapy, the patient has complained of cough.  We recommended treatment of the osteoporosis with Prolia  and she started that in May 2020 and had an increase in bone pain.  She continued to complain of bone pain in August.  She was seen in November 2020 prior to 2nd dose of Prolia , at which time she reported back pain.  MRI through her orthopedic office apparently revealed degenerative disc changes.  She also reported chronic nonproductive cough.  We recommended she continue pantoprazole 40 mg daily, as we felt acid reflux could be contributing to her cough.Annual bilateral mammogram in August of 2022 was clear.  She had COVID-19 in November 2022.  A bone density scan in June  2022 still shows osteoporosis of the femur with a T score stable at -2.6.  The spine shows improvement from a T score of -2.2 down to -1.4.  INTERVAL HISTORY:  Sheena Joyce is here for 6 month evaluation for stage IIA (T2 N53mic M0) hormone positive breast cancer. Patient states that she feels well and has no complaints of current pain, but does affirm intermittent headaches and left breast tenderness. She has moderate to severe fibrocystic changes of the left breast and no masses in either breast. She is currently taking 1 mg anastrozole , 5 mg Ditropan-XL, and 10 mg simvastatin daily. She will receive her Prolia  injection today. She has a WBC of 7.8, hemoglobin of 13.3, and platelet count of 269,000. Her CMP is completely normal. I encouraged her to keep drinking plenty of fluids. She is due for bone density scan so I will schedule this for her. She will complete her bilateral screening mammogram today. I will see her back in 6 months with CBC, CMP, and Prolia  injection.  She denies fever, chills, night sweats, or other signs of infection. She denies cardiorespiratory and gastrointestinal issues. Her appetite is good and Her weight has been stable. She is 167 lb 11.2 oz today.  REVIEW OF SYSTEMS:  Review of Systems  Constitutional:  Positive for fatigue. Negative for appetite change, chills, diaphoresis, fever and unexpected weight change.  HENT:   Negative for hearing loss, lump/mass, mouth sores, nosebleeds, sore throat, tinnitus, trouble swallowing and voice change.   Eyes: Negative.  Negative for eye problems and icterus.  Respiratory: Negative.  Negative for chest tightness, cough, hemoptysis, shortness of breath and wheezing.   Cardiovascular: Negative.  Negative for chest pain, leg swelling and palpitations.  Gastrointestinal:  Negative for abdominal distention, abdominal pain, blood in stool, constipation, diarrhea, nausea, rectal pain and vomiting.  Endocrine: Positive for hot flashes.   Genitourinary:  Positive for bladder incontinence. Negative for difficulty urinating, dyspareunia, dysuria, frequency, hematuria, menstrual problem, nocturia, pelvic pain, vaginal bleeding and vaginal discharge.   Musculoskeletal: Negative.  Negative for arthralgias, back pain, flank pain, gait problem, myalgias, neck pain and neck stiffness.       Left breast tenderness  Skin: Negative.  Negative for itching, rash and wound.  Neurological:  Positive for headaches (intermittent). Negative for dizziness, extremity weakness, gait problem, light-headedness, numbness, seizures and speech difficulty.  Hematological: Negative.  Negative for adenopathy. Does not bruise/bleed easily.  Psychiatric/Behavioral:  Positive for confusion. Negative for decreased concentration, depression, sleep disturbance and suicidal ideas. The patient is not nervous/anxious.   All other systems reviewed and are negative.   VITALS:  Blood pressure 114/73, pulse 81, temperature 97.6 F (36.4 C), temperature source Oral, resp. rate 18, height 4' 11 (1.499 m), weight 73.8 kg, SpO2 97%.  Wt Readings from Last 3 Encounters:  03/11/23 73.8 kg  09/15/22 76.3 kg  09/08/22 75.4 kg    There is no height or weight on file to calculate BMI.  Performance status (ECOG): 1 - Symptomatic but completely ambulatory  PHYSICAL EXAM:  Physical Exam Vitals and nursing note reviewed.  Constitutional:      General: She is not in  acute distress.    Appearance: Normal appearance. She is normal weight. She is not ill-appearing, toxic-appearing or diaphoretic.  HENT:     Head: Normocephalic and atraumatic.     Right Ear: Tympanic membrane, ear canal and external ear normal. There is no impacted cerumen.     Left Ear: Tympanic membrane, ear canal and external ear normal. There is no impacted cerumen.     Nose: Nose normal. No congestion or rhinorrhea.     Mouth/Throat:     Mouth: Mucous membranes are moist.     Pharynx: Oropharynx is  clear. No oropharyngeal exudate or posterior oropharyngeal erythema.  Eyes:     General: No scleral icterus.       Right eye: No discharge.        Left eye: No discharge.     Extraocular Movements: Extraocular movements intact.     Conjunctiva/sclera: Conjunctivae normal.     Pupils: Pupils are equal, round, and reactive to light.  Neck:     Vascular: No carotid bruit.  Cardiovascular:     Rate and Rhythm: Normal rate and regular rhythm.     Pulses: Normal pulses.     Heart sounds: Normal heart sounds. No murmur heard.    No friction rub. No gallop.  Pulmonary:     Effort: Pulmonary effort is normal. No respiratory distress.     Breath sounds: Normal breath sounds. No stridor. No wheezing, rhonchi or rales.  Chest:     Chest wall: No mass, lacerations, deformity, swelling, tenderness, crepitus or edema. There is no dullness to percussion.  Breasts:    Breasts are symmetrical.     Right: Tenderness present. No swelling, bleeding, inverted nipple, mass, nipple discharge or skin change.     Left: Tenderness present. No swelling, bleeding, inverted nipple, mass, nipple discharge or skin change.     Comments: Scar at the left upper chest at the site of her port. Well-healed scar in the superior right breast. Well-healed scar in the right axilla. Moderate to severe fibrocystic changes in the left breast with severe tenderness.  No masses in either breast. Abdominal:     General: Abdomen is protuberant. Bowel sounds are normal. There is no distension.     Palpations: Abdomen is soft. There is no hepatomegaly, splenomegaly or mass.     Tenderness: There is no abdominal tenderness. There is no right CVA tenderness, left CVA tenderness, guarding or rebound.     Hernia: No hernia is present.  Musculoskeletal:        General: No swelling, tenderness, deformity or signs of injury. Normal range of motion.     Cervical back: Normal range of motion and neck supple. No rigidity or tenderness.      Right lower leg: No edema.     Left lower leg: No edema.  Lymphadenopathy:     Cervical: No cervical adenopathy.     Upper Body:     Right upper body: No supraclavicular, axillary or pectoral adenopathy.     Left upper body: No supraclavicular, axillary or pectoral adenopathy.  Skin:    General: Skin is warm and dry.     Capillary Refill: Capillary refill takes less than 2 seconds.     Coloration: Skin is not jaundiced or pale.     Findings: No bruising, erythema, lesion or rash.  Neurological:     General: No focal deficit present.     Mental Status: She is alert and oriented to person, place, and time.  Mental status is at baseline.     Cranial Nerves: No cranial nerve deficit.     Sensory: No sensory deficit.     Motor: No weakness.     Coordination: Coordination normal.     Gait: Gait normal.     Deep Tendon Reflexes: Reflexes normal.  Psychiatric:        Mood and Affect: Mood normal.        Behavior: Behavior normal.        Thought Content: Thought content normal.        Judgment: Judgment normal.    LABS:      Latest Ref Rng & Units 03/11/2023    9:47 AM 09/08/2022   11:19 AM 03/05/2022   10:44 AM  CBC  WBC 4.0 - 10.5 K/uL 7.3  8.1  5.9   Hemoglobin 12.0 - 15.0 g/dL 87.2  86.6  87.2   Hematocrit 36.0 - 46.0 % 38.5  42.4  40.5   Platelets 150 - 400 K/uL 252  277  268       Latest Ref Rng & Units 09/08/2022   11:19 AM 03/05/2022   10:44 AM 09/02/2021   12:00 AM  CMP  Glucose 70 - 99 mg/dL 894  815    BUN 8 - 23 mg/dL 14  16  18       Creatinine 0.44 - 1.00 mg/dL 8.98  9.07  0.8      Sodium 135 - 145 mmol/L 138  137  136      Potassium 3.5 - 5.1 mmol/L 3.9  4.2  4.2      Chloride 98 - 111 mmol/L 100  104  104      CO2 22 - 32 mmol/L 26  23  24       Calcium 8.9 - 10.3 mg/dL 9.5  9.1  9.5      Total Protein 6.5 - 8.1 g/dL 7.0  6.7    Total Bilirubin 0.3 - 1.2 mg/dL 0.5  0.5    Alkaline Phos 38 - 126 U/L 55  49  65      AST 15 - 41 U/L 21  22  22       ALT 0 - 44 U/L 15   17  17          This result is from an external source.   No results found for: CEA1, CEA / No results found for: CEA1, CEA No results found for: PSA1 No results found for: CAN199 No results found for: CAN125  No results found for: TOTALPROTELP, ALBUMINELP, A1GS, A2GS, BETS, BETA2SER, GAMS, MSPIKE, SPEI No results found for: TIBC, FERRITIN, IRONPCTSAT No results found for: LDH  STUDIES:           HISTORY:   Past Medical History:  Diagnosis Date   Anxiety    Arthritis    Cancer of overlapping sites of right female breast (HCC) 10/19/2017   Complication of anesthesia    became unresposive after surgery- 2017 , aspirated after surgery and had pneumonia- 2017    Depression    Diabetes mellitus without complication (HCC)    on the borderline   Diverticulitis    Family history of adverse reaction to anesthesia    mother slow to wake up afgter 5 days, sister had port placed and had issues - went into coma but was very sick    GERD (gastroesophageal reflux disease)    Headache    Pneumonia    PONV (postoperative nausea  and vomiting)    Prolapsed uterus    and bladder    Past Surgical History:  Procedure Laterality Date   BREAST LUMPECTOMY WITH RADIOACTIVE SEED AND SENTINEL LYMPH NODE BIOPSY Right 10/19/2017   Procedure: INJECT BLUE DYE RIGHT BREAST AND RIGHT BREAST LUMPECTOMY WITH TWO RADIOACTIVE SEEDS AND RIGHT AXILLARY SENTINEL LYMPH NODE BIOPSY;  Surgeon: Gail Favorite, MD;  Location: [facility] OR;  Service: General;  Laterality: Right;   COLON SURGERY     HERNIA REPAIR     PORTACATH PLACEMENT Left 12/07/2017   Procedure: INSERTION PORT-A-CATH WITH ULTRA SOUND;  Surgeon: Gail Favorite, MD;  Location: [facility] ORS;  Service: General;  Laterality: Left;    Family History  Problem Relation Age of Onset   Osteoporosis Mother    Heart disease Father     Social History:  reports that she has never smoked. She has never used smokeless  tobacco. She reports that she does not drink alcohol and does not use drugs.The patient is alone  today.  Allergies:  Allergies  Allergen Reactions   Other     Other reaction(s): Other (See Comments) Pt states medication after sx made her unresponsive, sounds like oxycodone  but can't remember the name   Codeine Rash   Hydrocodone  Rash    Current Medications: Current Outpatient Medications  Medication Sig Dispense Refill   anastrozole  (ARIMIDEX ) 1 MG tablet Take 1 tablet (1 mg total) by mouth daily. 90 tablet 3   Calcium-Vitamin D-Vitamin K 650-12.5-40 MG-MCG-MCG CHEW Chew 1,300 mg by mouth daily.     Multiple Vitamin (MULTI-VITAMIN) tablet Take 2 tablets by mouth daily.     oxybutynin (DITROPAN-XL) 5 MG 24 hr tablet Take 5 mg by mouth daily.     pantoprazole (PROTONIX) 40 MG tablet Take 1 tablet by mouth daily.     rizatriptan (MAXALT) 10 MG tablet Take 5-10 mg by mouth 2 (two) times daily as needed for migraine. May repeat in 2 hours if needed     simvastatin (ZOCOR) 20 MG tablet Take 20 mg by mouth at bedtime.     traZODone  (DESYREL ) 50 MG tablet TAKE ONE TABLET BY MOUTH ONCE DAILY AT BEDTIME 90 tablet 5   venlafaxine XR (EFFEXOR-XR) 75 MG 24 hr capsule Take 75 mg by mouth daily with breakfast.     No current facility-administered medications for this visit.     ASSESSMENT & PLAN:   Assessment: 1. Stage IIA breast cancer.  She remains without evidence of recurrence, now 5 years out.  She will continue anastrozole  as long as we can treat her osteoporosis. She has already completed 5 years.  However I pulled out her original Endopredict report from September of 2019.  This was high risk with an EpClin score of 3.8 for a 15% risk of distant recurrence in the next 10 years.  Her absolute chemotherapy benefit was 6%.  Her likelihood of a late distant recurrence in years 5-15 was 12%, which I consider fairly high.  We will therefore continue consider treating her with extended  adjuvant hormonal therapy for 7 to 10 years total if we can control her osteoporosis and she has no other significant toxicities.  2. History of subsegmental pulmonary embolism in October 2019.  She treated with enoxaparin , then rivaroxaban.  Anticoagulation was discontinued in February 2020. She does not have symptoms suggestive of recurrence.  3. Osteoporosis, for which she is receiving Prolia  injections every 6 months as of May 2020.  She had significant bone pain associated with  this, but started Claritin and did not have as much bone pain with her next injection. I will schedule her bone density scan now.  4. History of COVID 19 in November of 2022.    Plan: She feels well and has no complaints of current pain, but does affirm intermittent headaches and left breast tenderness. She has moderate to severe fibrocystic changes of the left breast and no masses in either breast. She is currently taking 1 mg anastrozole , 5 mg Ditropan-XL, and 10 mg simvastatin daily. She will receive her Prolia  injection today. She has a WBC of 7.8, hemoglobin of 13.3, and platelet count of 269,000. Her CMP is completely normal. I encouraged her to keep drinking plenty of fluids. She is due for bone density scan so I will schedule this for her. She will complete her bilateral screening mammogram today. I will see her back in 6 months with CBC, CMP, and Prolia  injection. I discussed the assessment and treatment plan with the patient and she was provided an opportunity to ask questions and all were answered. The patient agreed with the plan and demonstrated an understanding of the instructions.   I provided 17 minutes of face-to-face time during this this encounter and > 50% was spent counseling as documented under my assessment and plan.   Sheena VEAR Cornish, MD  [facility] A DEPT OF [facility] [address] [place] [address] Dept: [phone] Dept  Fax: [phone]   I, Aretta Cook, acting as a scribe for Sheena VEAR Cornish, MD, have documented all relevant documentation on the behalf of Sheena VEAR Cornish, MD, as directed by Sheena VEAR Cornish, MD, while in the presence of Sheena VEAR Cornish, MD.  I have reviewed this report as typed by the medical scribe, and it is complete and accurate.    "

## 2024-03-16 NOTE — Telephone Encounter (Signed)
 Patient has been scheduled for follow-up visit per 03/16/2024 LOS.  Pt given an appt calendar with date and time.

## 2024-03-16 NOTE — Patient Instructions (Signed)
 Denosumab Injection (Osteoporosis) What is this medication? DENOSUMAB (den oh SUE mab) prevents and treats osteoporosis. It works by Interior and spatial designer stronger and less likely to break (fracture). It is a monoclonal antibody. This medicine may be used for other purposes; ask your health care provider or pharmacist if you have questions. COMMON BRAND NAME(S): Prolia What should I tell my care team before I take this medication? They need to know if you have any of these conditions: Dental or gum disease Had thyroid or parathyroid (glands located in neck) surgery Having dental surgery or a tooth pulled Kidney disease Low levels of calcium in the blood On dialysis Poor nutrition Thyroid disease Trouble absorbing nutrients from your food An unusual or allergic reaction to denosumab, other medications, foods, dyes, or preservatives Pregnant or trying to get pregnant Breastfeeding How should I use this medication? This medication is injected under the skin. It is given by your care team in a hospital or clinic setting. A special MedGuide will be given to you before each treatment. Be sure to read this information carefully each time. Talk to your care team about the use of this medication in children. Special care may be needed. Overdosage: If you think you have taken too much of this medicine contact a poison control center or emergency room at once. NOTE: This medicine is only for you. Do not share this medicine with others. What if I miss a dose? Keep appointments for follow-up doses. It is important not to miss your dose. Call your care team if you are unable to keep an appointment. What may interact with this medication? Do not take this medication with any of the following: Other medications that contain denosumab This medication may also interact with the following: Medications that lower your chance of fighting infection Steroid medications, such as prednisone or cortisone This  list may not describe all possible interactions. Give your health care provider a list of all the medicines, herbs, non-prescription drugs, or dietary supplements you use. Also tell them if you smoke, drink alcohol, or use illegal drugs. Some items may interact with your medicine. What should I watch for while using this medication? Your condition will be monitored carefully while you are receiving this medication. You may need blood work done while taking this medication. This medication may increase your risk of getting an infection. Call your care team for advice if you get a fever, chills, sore throat, or other symptoms of a cold or flu. Do not treat yourself. Try to avoid being around people who are sick. Tell your dentist and dental surgeon that you are taking this medication. You should not have major dental surgery while on this medication. See your dentist to have a dental exam and fix any dental problems before starting this medication. Take good care of your teeth while on this medication. Make sure you see your dentist for regular follow-up appointments. This medication may cause low levels of calcium in your body. The risk of severe side effects is increased in people with kidney disease. Your care team may prescribe calcium and vitamin D to help prevent low calcium levels while you take this medication. It is important to take calcium and vitamin D as directed by your care team. Talk to your care team if you may be pregnant. Serious birth defects may occur if you take this medication during pregnancy and for 5 months after the last dose. You will need a negative pregnancy test before starting this medication. Contraception  is recommended while taking this medication and for 5 months after the last dose. Your care team can help you find the option that works for you. Talk to your care team before breastfeeding. Changes to your treatment plan may be needed. What side effects may I notice from  receiving this medication? Side effects that you should report to your care team as soon as possible: Allergic reactions--skin rash, itching, hives, swelling of the face, lips, tongue, or throat Infection--fever, chills, cough, sore throat, wounds that don't heal, pain or trouble when passing urine, general feeling of discomfort or being unwell Low calcium level--muscle pain or cramps, confusion, tingling, or numbness in the hands or feet Osteonecrosis of the jaw--pain, swelling, or redness in the mouth, numbness of the jaw, poor healing after dental work, unusual discharge from the mouth, visible bones in the mouth Severe bone, joint, or muscle pain Skin infection--skin redness, swelling, warmth, or pain Side effects that usually do not require medical attention (report these to your care team if they continue or are bothersome): Back pain Headache Joint pain Muscle pain Pain in the hands, arms, legs, or feet Runny or stuffy nose Sore throat This list may not describe all possible side effects. Call your doctor for medical advice about side effects. You may report side effects to FDA at 1-800-FDA-1088. Where should I keep my medication? This medication is given in a hospital or clinic. It will not be stored at home. NOTE: This sheet is a summary. It may not cover all possible information. If you have questions about this medicine, talk to your doctor, pharmacist, or health care provider.  2024 Elsevier/Gold Standard (2022-03-23 00:00:00)

## 2024-03-22 ENCOUNTER — Encounter: Payer: Self-pay | Admitting: Oncology

## 2024-03-27 ENCOUNTER — Telehealth: Payer: Self-pay

## 2024-03-27 NOTE — Telephone Encounter (Signed)
 Attempted to contact patient. No answer.

## 2024-03-27 NOTE — Telephone Encounter (Signed)
-----   Message from Wanda Cornish, MD sent at 03/22/2024  7:23 PM EST ----- Regarding: call Mammo all clear

## 2024-09-13 ENCOUNTER — Inpatient Hospital Stay: Admitting: Oncology

## 2024-09-13 ENCOUNTER — Inpatient Hospital Stay
